# Patient Record
Sex: Male | Born: 1990 | Race: Black or African American | Hispanic: No | Marital: Single | Smoking: Current every day smoker
Health system: Southern US, Community
[De-identification: ages and names within clinical notes are randomized; demographics above are authoritative.]

## PROBLEM LIST (undated history)

## (undated) DIAGNOSIS — J45909 Unspecified asthma, uncomplicated: Secondary | ICD-10-CM

---
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
## 2013-06-06 ENCOUNTER — Encounter (HOSPITAL_COMMUNITY): Payer: Self-pay | Admitting: Emergency Medicine

## 2013-06-06 ENCOUNTER — Emergency Department (HOSPITAL_COMMUNITY)
Admission: EM | Admit: 2013-06-06 | Discharge: 2013-06-06 | Discharge status: Against Medical Advice | Payer: Self-pay | Attending: Emergency Medicine | Admitting: Emergency Medicine

## 2013-06-06 DIAGNOSIS — Y939 Activity, unspecified: Secondary | ICD-10-CM | POA: Insufficient documentation

## 2013-06-06 DIAGNOSIS — J45909 Unspecified asthma, uncomplicated: Secondary | ICD-10-CM | POA: Insufficient documentation

## 2013-06-06 DIAGNOSIS — IMO0002 Reserved for concepts with insufficient information to code with codable children: Secondary | ICD-10-CM

## 2013-06-06 DIAGNOSIS — S61509A Unspecified open wound of unspecified wrist, initial encounter: Secondary | ICD-10-CM | POA: Insufficient documentation

## 2013-06-06 DIAGNOSIS — Y929 Unspecified place or not applicable: Secondary | ICD-10-CM | POA: Insufficient documentation

## 2013-06-06 DIAGNOSIS — R296 Repeated falls: Secondary | ICD-10-CM | POA: Insufficient documentation

## 2013-06-06 DIAGNOSIS — F17200 Nicotine dependence, unspecified, uncomplicated: Secondary | ICD-10-CM | POA: Insufficient documentation

## 2013-06-06 HISTORY — DX: Unspecified asthma, uncomplicated: J45.909
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
## 2013-06-06 NOTE — Discharge Instructions (Signed)
Today you came to the Emergency Department after sustaining and injury to your hand. By not letting us clean the area you are putting yourself at risk for infection. By refusing X-ray it is possible we have missed a fracture and you will have complications with this in the long term. By refusing your TDaP booster you are putting yourself at risk for tetanus. By refusing this care you are putting yourself at risk for both short term and long term complications including infection, increased pain, and death.   Laceration Care, Adult A laceration is a cut that goes through all layers of the skin. The cut goes into the tissue beneath the skin. HOME CARE For stitches (sutures) or staples:  Keep the cut clean and dry.  If you have a bandage (dressing), change it at least once a day. Change the bandage if it gets wet or dirty, or as told by your doctor.  Wash the cut with soap and water 2 times a day. Rinse the cut with water. Pat it dry with a clean towel.  Put a thin layer of medicated cream on the cut as told by your doctor.  You may shower after the first 24 hours. Do not soak the cut in water until the stitches are removed.  Only take medicines as told by your doctor.  Have your stitches or staples removed as told by your doctor. For skin adhesive strips:  Keep the cut clean and dry.  Do not get the strips wet. You may take a bath, but be careful to keep the cut dry.  If the cut gets wet, pat it dry with a clean towel.  The strips will fall off on their own. Do not remove the strips that are still stuck to the cut. For wound glue:  You may shower or take baths. Do not soak or scrub the cut. Do not swim. Avoid heavy sweating until the glue falls off on its own. After a shower or bath, pat the cut dry with a clean towel.  Do not put medicine on your cut until the glue falls off.  If you have a bandage, do not put tape over the glue.  Avoid lots of sunlight or tanning lamps until the  glue falls off. Put sunscreen on the cut for the first year to reduce your scar.  The glue will fall off on its own. Do not pick at the glue. You may need a tetanus shot if:  You cannot remember when you had your last tetanus shot.  You have never had a tetanus shot. If you need a tetanus shot and you choose not to have one, you may get tetanus. Sickness from tetanus can be serious. GET HELP RIGHT AWAY IF:   Your pain does not get better with medicine.  Your arm, hand, leg, or foot loses feeling (numbness) or changes color.  Your cut is bleeding.  Your joint feels weak, or you cannot use your joint.  You have painful lumps on your body.  Your cut is red, puffy (swollen), or painful.  You have a red line on the skin near the cut.  You have yellowish-white fluid (pus) coming from the cut.  You have a fever.  You have a bad smell coming from the cut or bandage.  Your cut breaks open before or after stitches are removed.  You notice something coming out of the cut, such as wood or glass.  You cannot move a finger or toe. MAKE  SURE YOU:   Understand these instructions.  Will watch your condition.  Will get help right away if you are not doing well or get worse. Document Released: 10/06/2007 Document Revised: 07/12/2011 Document Reviewed: 10/13/2010 [facility] Patient Information 2014 [place], [place].

## 2013-06-06 NOTE — ED Provider Notes (Signed)
Medical screening examination/treatment/procedure(s) were performed by non-physician practitioner and as supervising physician I was immediately available for consultation/collaboration.  EKG Interpretation   None       Devoria AlbeIva Ether Goebel, MD, Armando GangFACEP   Ward GivensIva L Latanya Hemmer, MD 06/06/13 1018
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
## 2014-03-16 ENCOUNTER — Inpatient Hospital Stay (HOSPITAL_COMMUNITY)
Admission: EM | Admit: 2014-03-16 | Discharge: 2014-03-19 | DRG: 914 | Disposition: A | Discharge status: Home / Self Care | Payer: Self-pay | Attending: General Surgery | Admitting: General Surgery

## 2014-03-16 ENCOUNTER — Emergency Department (HOSPITAL_COMMUNITY): Payer: Self-pay

## 2014-03-16 ENCOUNTER — Other Ambulatory Visit: Payer: Self-pay

## 2014-03-16 DIAGNOSIS — S27321A Contusion of lung, unilateral, initial encounter: Secondary | ICD-10-CM | POA: Diagnosis present

## 2014-03-16 DIAGNOSIS — S2241XA Multiple fractures of ribs, right side, initial encounter for closed fracture: Secondary | ICD-10-CM | POA: Diagnosis present

## 2014-03-16 DIAGNOSIS — S42114A Nondisplaced fracture of body of scapula, right shoulder, initial encounter for closed fracture: Secondary | ICD-10-CM | POA: Diagnosis present

## 2014-03-16 DIAGNOSIS — W3400XA Accidental discharge from unspecified firearms or gun, initial encounter: Secondary | ICD-10-CM

## 2014-03-16 DIAGNOSIS — S21339A Puncture wound without foreign body of unspecified front wall of thorax with penetration into thoracic cavity, initial encounter: Secondary | ICD-10-CM

## 2014-03-16 DIAGNOSIS — S21331A Puncture wound without foreign body of right front wall of thorax with penetration into thoracic cavity, initial encounter: Secondary | ICD-10-CM

## 2014-03-16 DIAGNOSIS — S6431XD Injury of digital nerve of right thumb, subsequent encounter: Secondary | ICD-10-CM

## 2014-03-16 DIAGNOSIS — S21301A Unspecified open wound of right front wall of thorax with penetration into thoracic cavity, initial encounter: Principal | ICD-10-CM | POA: Diagnosis present

## 2014-03-16 DIAGNOSIS — S42101A Fracture of unspecified part of scapula, right shoulder, initial encounter for closed fracture: Secondary | ICD-10-CM | POA: Diagnosis present

## 2014-03-16 DIAGNOSIS — F17210 Nicotine dependence, cigarettes, uncomplicated: Secondary | ICD-10-CM | POA: Diagnosis present

## 2014-03-16 DIAGNOSIS — S21131A Puncture wound without foreign body of right front wall of thorax without penetration into thoracic cavity, initial encounter: Secondary | ICD-10-CM

## 2014-03-16 DIAGNOSIS — Z9689 Presence of other specified functional implants: Secondary | ICD-10-CM

## 2014-03-16 DIAGNOSIS — J942 Hemothorax: Secondary | ICD-10-CM

## 2014-03-16 DIAGNOSIS — S272XXA Traumatic hemopneumothorax, initial encounter: Secondary | ICD-10-CM | POA: Diagnosis present

## 2014-03-16 DIAGNOSIS — S56021D Laceration of flexor muscle, fascia and tendon of right thumb at forearm level, subsequent encounter: Secondary | ICD-10-CM

## 2014-03-16 DIAGNOSIS — J939 Pneumothorax, unspecified: Secondary | ICD-10-CM

## 2014-03-16 LAB — I-STAT CHEM 8, ED
BUN: 5 mg/dL — ABNORMAL LOW (ref 6–23)
Calcium, Ion: 1.08 mmol/L — ABNORMAL LOW (ref 1.12–1.23)
Chloride: 105 mEq/L (ref 96–112)
Creatinine, Ser: 1.2 mg/dL (ref 0.50–1.35)
Glucose, Bld: 106 mg/dL — ABNORMAL HIGH (ref 70–99)
HCT: 44 % (ref 39.0–52.0)
HEMOGLOBIN: 15 g/dL (ref 13.0–17.0)
Potassium: 3 mEq/L — ABNORMAL LOW (ref 3.7–5.3)
SODIUM: 141 meq/L (ref 137–147)
TCO2: 21 mmol/L (ref 0–100)

## 2014-03-16 LAB — PROTIME-INR
INR: 1.15 (ref 0.00–1.49)
PROTHROMBIN TIME: 14.9 s (ref 11.6–15.2)

## 2014-03-16 LAB — COMPREHENSIVE METABOLIC PANEL
ALT: 20 U/L (ref 0–53)
ANION GAP: 17 — AB (ref 5–15)
AST: 28 U/L (ref 0–37)
Albumin: 3.7 g/dL (ref 3.5–5.2)
Alkaline Phosphatase: 54 U/L (ref 39–117)
BUN: 7 mg/dL (ref 6–23)
CALCIUM: 8.8 mg/dL (ref 8.4–10.5)
CO2: 21 mEq/L (ref 19–32)
CREATININE: 1.03 mg/dL (ref 0.50–1.35)
Chloride: 101 mEq/L (ref 96–112)
GFR calc non Af Amer: >90 mL/min (ref >90)
GLUCOSE: 108 mg/dL — AB (ref 70–99)
Potassium: 3.3 mEq/L — ABNORMAL LOW (ref 3.7–5.3)
Sodium: 139 mEq/L (ref 137–147)
TOTAL PROTEIN: 6.9 g/dL (ref 6.0–8.3)
Total Bilirubin: 0.3 mg/dL (ref 0.3–1.2)

## 2014-03-16 LAB — URINE MICROSCOPIC-ADD ON

## 2014-03-16 LAB — CBC
HCT: 43 % (ref 39.0–52.0)
HEMOGLOBIN: 14.7 g/dL (ref 13.0–17.0)
MCH: 33 pg (ref 26.0–34.0)
MCHC: 34.2 g/dL (ref 30.0–36.0)
MCV: 96.6 fL (ref 78.0–100.0)
Platelets: 181 10*3/uL (ref 150–400)
RBC: 4.45 MIL/uL (ref 4.22–5.81)
RDW: 13.2 % (ref 11.5–15.5)
WBC: 12.6 10*3/uL — AB (ref 4.0–10.5)

## 2014-03-16 LAB — URINALYSIS, ROUTINE W REFLEX MICROSCOPIC
Bilirubin Urine: NEGATIVE
Glucose, UA: NEGATIVE mg/dL
HGB URINE DIPSTICK: NEGATIVE
Ketones, ur: NEGATIVE mg/dL
NITRITE: NEGATIVE
Protein, ur: NEGATIVE mg/dL
SPECIFIC GRAVITY, URINE: 1.006 (ref 1.005–1.030)
Urobilinogen, UA: 0.2 mg/dL (ref 0.0–1.0)
pH: 6 (ref 5.0–8.0)

## 2014-03-16 LAB — I-STAT CG4 LACTIC ACID, ED: LACTIC ACID, VENOUS: 5.73 mmol/L — AB (ref 0.5–2.2)

## 2014-03-16 LAB — RAPID URINE DRUG SCREEN, HOSP PERFORMED
Amphetamines: NOT DETECTED
BARBITURATES: NOT DETECTED
Benzodiazepines: NOT DETECTED
COCAINE: NOT DETECTED
OPIATES: NOT DETECTED
Tetrahydrocannabinol: POSITIVE — AB

## 2014-03-16 LAB — ETHANOL: Alcohol, Ethyl (B): 101 mg/dL — ABNORMAL HIGH (ref 0–11)

## 2014-03-16 MED ORDER — ONDANSETRON HCL 4 MG/2ML IJ SOLN
INTRAMUSCULAR | Status: AC
  Administered 2014-03-16: 4 mg
  Filled 2014-03-16: qty 2

## 2014-03-16 MED ORDER — MORPHINE SULFATE 2 MG/ML IJ SOLN: 2.0000 mg | Freq: Once | INTRAMUSCULAR | Status: DC

## 2014-03-16 MED ORDER — LACTATED RINGERS IV SOLN
INTRAVENOUS | Status: DC
  Administered 2014-03-16: 10 mL/h via INTRAVENOUS

## 2014-03-16 MED ORDER — LIDOCAINE HCL (CARDIAC) 20 MG/ML IV SOLN
INTRAVENOUS | Status: AC
  Filled 2014-03-16: qty 5

## 2014-03-16 MED ORDER — MORPHINE SULFATE 2 MG/ML IJ SOLN
INTRAMUSCULAR | Status: AC
  Administered 2014-03-16: 2 mg via INTRAVENOUS
  Filled 2014-03-16: qty 2

## 2014-03-16 MED ORDER — MORPHINE SULFATE 2 MG/ML IJ SOLN
2.0000 mg | Freq: Once | INTRAMUSCULAR | Status: AC
  Administered 2014-03-16 (×2): 2 mg via INTRAVENOUS

## 2014-03-16 MED ORDER — SUCCINYLCHOLINE CHLORIDE 20 MG/ML IJ SOLN
INTRAMUSCULAR | Status: AC
  Filled 2014-03-16: qty 1

## 2014-03-16 MED ORDER — ETOMIDATE 2 MG/ML IV SOLN
INTRAVENOUS | Status: AC
  Filled 2014-03-16: qty 20

## 2014-03-16 MED ORDER — ROCURONIUM BROMIDE 50 MG/5ML IV SOLN
INTRAVENOUS | Status: AC
  Filled 2014-03-16: qty 2

## 2014-03-16 MED ORDER — ONDANSETRON HCL 4 MG/2ML IJ SOLN: 4.0000 mg | Freq: Once | INTRAMUSCULAR | Status: DC

## 2014-03-16 MED ORDER — IOHEXOL 300 MG/ML  SOLN
75.0000 mL | Freq: Once | INTRAMUSCULAR | Status: AC | PRN
  Administered 2014-03-16: 75 mL via INTRAVENOUS

## 2014-03-16 NOTE — ED Notes (Signed)
VSS, updated, calm, NAD, resting on NRB 15L. No dyspnea noted.

## 2014-03-16 NOTE — ED Notes (Signed)
VSS,  no dyspnea, IVF infusing, calm, NAD, no changes, to CT 2.

## 2014-03-16 NOTE — ED Notes (Signed)
Delay in blood draw, slow return, attempt aborted for CXR, xray done.

## 2014-03-16 NOTE — ED Notes (Signed)
Removed from LSB

## 2014-03-16 NOTE — ED Notes (Signed)
[facility]

## 2014-03-16 NOTE — ED Notes (Addendum)
CT complete, no changes, pt updated. Pt uncooperative, not answering questions, yet is talkative and argumentative. Won't answer pain score.

## 2014-03-16 NOTE — ED Notes (Signed)
Onto CT table (CT2), no changes, VSS.
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
## 2014-03-16 NOTE — ED Notes (Signed)
[facility]

## 2014-03-16 NOTE — ED Notes (Signed)
Dr. Corliss Skainssuei at explaining need for chest tube.  Rationale given. Benefits and risks explained. Pt declining/ refusing. A&Ox4.  States, "I have decided, don't want a chest tube".

## 2014-03-17 ENCOUNTER — Emergency Department (HOSPITAL_COMMUNITY): Payer: Self-pay

## 2014-03-17 ENCOUNTER — Encounter (HOSPITAL_COMMUNITY): Payer: Self-pay | Admitting: *Deleted

## 2014-03-17 DIAGNOSIS — S42101A Fracture of unspecified part of scapula, right shoulder, initial encounter for closed fracture: Secondary | ICD-10-CM | POA: Diagnosis present

## 2014-03-17 DIAGNOSIS — S272XXA Traumatic hemopneumothorax, initial encounter: Secondary | ICD-10-CM | POA: Diagnosis present

## 2014-03-17 DIAGNOSIS — S21339A Puncture wound without foreign body of unspecified front wall of thorax with penetration into thoracic cavity, initial encounter: Secondary | ICD-10-CM

## 2014-03-17 DIAGNOSIS — S2241XA Multiple fractures of ribs, right side, initial encounter for closed fracture: Secondary | ICD-10-CM | POA: Diagnosis present

## 2014-03-17 DIAGNOSIS — S27321A Contusion of lung, unilateral, initial encounter: Secondary | ICD-10-CM | POA: Diagnosis present

## 2014-03-17 DIAGNOSIS — W3400XA Accidental discharge from unspecified firearms or gun, initial encounter: Secondary | ICD-10-CM

## 2014-03-17 LAB — COMPREHENSIVE METABOLIC PANEL
ALK PHOS: 51 U/L (ref 39–117)
ALT: 24 U/L (ref 0–53)
AST: 45 U/L — AB (ref 0–37)
Albumin: 3.7 g/dL (ref 3.5–5.2)
Anion gap: 10 (ref 5–15)
BILIRUBIN TOTAL: 0.8 mg/dL (ref 0.3–1.2)
BUN: 5 mg/dL — AB (ref 6–23)
CHLORIDE: 97 meq/L (ref 96–112)
CO2: 28 mEq/L (ref 19–32)
Calcium: 8.7 mg/dL (ref 8.4–10.5)
Creatinine, Ser: 0.81 mg/dL (ref 0.50–1.35)
GFR calc Af Amer: >90 mL/min (ref >90)
GFR calc non Af Amer: >90 mL/min (ref >90)
Glucose, Bld: 138 mg/dL — ABNORMAL HIGH (ref 70–99)
POTASSIUM: 4.1 meq/L (ref 3.7–5.3)
SODIUM: 135 meq/L — AB (ref 137–147)
Total Protein: 6.8 g/dL (ref 6.0–8.3)

## 2014-03-17 LAB — TYPE AND SCREEN
ABO/RH(D): A NEG
Antibody Screen: NEGATIVE
Unit division: 0
Unit division: 0

## 2014-03-17 LAB — CBC
HCT: 42.6 % (ref 39.0–52.0)
Hemoglobin: 14.5 g/dL (ref 13.0–17.0)
MCH: 32.4 pg (ref 26.0–34.0)
MCHC: 34 g/dL (ref 30.0–36.0)
MCV: 95.3 fL (ref 78.0–100.0)
PLATELETS: 168 10*3/uL (ref 150–400)
RBC: 4.47 MIL/uL (ref 4.22–5.81)
RDW: 13.3 % (ref 11.5–15.5)
WBC: 17.6 10*3/uL — AB (ref 4.0–10.5)

## 2014-03-17 LAB — ABO/RH: ABO/RH(D): A NEG

## 2014-03-17 LAB — CDS SEROLOGY

## 2014-03-17 LAB — MRSA PCR SCREENING: MRSA by PCR: NEGATIVE

## 2014-03-17 MED ORDER — MIDAZOLAM HCL 5 MG/5ML IJ SOLN
INTRAMUSCULAR | Status: AC | PRN
  Administered 2014-03-17: 2 mg via INTRAVENOUS

## 2014-03-17 MED ORDER — LIDOCAINE-EPINEPHRINE (PF) 2 %-1:200000 IJ SOLN
10.0000 mL | Freq: Once | INTRAMUSCULAR | Status: AC
  Administered 2014-03-17: 10 mL via INTRADERMAL

## 2014-03-17 MED ORDER — MIDAZOLAM HCL 2 MG/2ML IJ SOLN
INTRAMUSCULAR | Status: AC
  Administered 2014-03-17: 2 mg via INTRAVENOUS
  Filled 2014-03-17: qty 6

## 2014-03-17 MED ORDER — HYDROMORPHONE HCL 1 MG/ML IJ SOLN
0.5000 mg | INTRAMUSCULAR | Status: DC | PRN
  Administered 2014-03-18 – 2014-03-19 (×3): 0.5 mg via INTRAVENOUS
  Filled 2014-03-17 (×3): qty 1

## 2014-03-17 MED ORDER — HYDROMORPHONE HCL 1 MG/ML IJ SOLN
1.0000 mg | INTRAMUSCULAR | Status: DC | PRN
  Administered 2014-03-17 (×2): 1 mg via INTRAVENOUS
  Filled 2014-03-17 (×2): qty 1

## 2014-03-17 MED ORDER — OXYCODONE HCL 5 MG PO TABS
5.0000 mg | ORAL_TABLET | ORAL | Status: DC | PRN
  Administered 2014-03-17 (×2): 15 mg via ORAL
  Administered 2014-03-17: 10 mg via ORAL
  Administered 2014-03-17 – 2014-03-19 (×9): 15 mg via ORAL
  Filled 2014-03-17 (×7): qty 3
  Filled 2014-03-17: qty 2
  Filled 2014-03-17 (×4): qty 3

## 2014-03-17 MED ORDER — MIDAZOLAM HCL 2 MG/2ML IJ SOLN
2.0000 mg | Freq: Once | INTRAMUSCULAR | Status: AC
  Administered 2014-03-17: 2 mg via INTRAVENOUS

## 2014-03-17 MED ORDER — NAPROXEN 500 MG PO TABS
500.0000 mg | ORAL_TABLET | Freq: Two times a day (BID) | ORAL | Status: DC
  Administered 2014-03-17 – 2014-03-19 (×5): 500 mg via ORAL
  Filled 2014-03-17 (×5): qty 1
  Filled 2014-03-17: qty 2
  Filled 2014-03-17 (×2): qty 1

## 2014-03-17 MED ORDER — FENTANYL CITRATE 0.05 MG/ML IJ SOLN
INTRAMUSCULAR | Status: AC
  Administered 2014-03-17: 100 ug via INTRAVENOUS
  Filled 2014-03-17: qty 4

## 2014-03-17 MED ORDER — DOCUSATE SODIUM 100 MG PO CAPS
100.0000 mg | ORAL_CAPSULE | Freq: Two times a day (BID) | ORAL | Status: DC
  Administered 2014-03-18 – 2014-03-19 (×3): 100 mg via ORAL
  Filled 2014-03-17 (×5): qty 1

## 2014-03-17 MED ORDER — LIDOCAINE-EPINEPHRINE (PF) 2 %-1:200000 IJ SOLN
INTRAMUSCULAR | Status: AC
  Administered 2014-03-17: 10 mL via INTRADERMAL
  Filled 2014-03-17: qty 20

## 2014-03-17 MED ORDER — POLYETHYLENE GLYCOL 3350 17 G PO PACK
17.0000 g | PACK | Freq: Every day | ORAL | Status: DC
  Administered 2014-03-19: 17 g via ORAL
  Filled 2014-03-17 (×3): qty 1

## 2014-03-17 MED ORDER — ONDANSETRON HCL 4 MG PO TABS: 4.0000 mg | ORAL_TABLET | Freq: Four times a day (QID) | ORAL | Status: DC | PRN

## 2014-03-17 MED ORDER — ONDANSETRON HCL 4 MG/2ML IJ SOLN: 4.0000 mg | Freq: Four times a day (QID) | INTRAMUSCULAR | Status: DC | PRN

## 2014-03-17 MED ORDER — FENTANYL CITRATE 0.05 MG/ML IJ SOLN
100.0000 ug | Freq: Once | INTRAMUSCULAR | Status: AC
  Administered 2014-03-17: 100 ug via INTRAVENOUS

## 2014-03-17 MED ORDER — DIPHENHYDRAMINE HCL 50 MG/ML IJ SOLN
25.0000 mg | Freq: Four times a day (QID) | INTRAMUSCULAR | Status: DC | PRN
  Administered 2014-03-17 – 2014-03-18 (×3): 25 mg via INTRAVENOUS
  Filled 2014-03-17 (×3): qty 1

## 2014-03-17 MED ORDER — SODIUM CHLORIDE 0.9 % IV SOLN
Freq: Once | INTRAVENOUS | Status: AC
  Administered 2014-03-17: 20 mL/h via INTRAVENOUS

## 2014-03-17 MED ORDER — CEFAZOLIN SODIUM 1-5 GM-% IV SOLN
1.0000 g | Freq: Three times a day (TID) | INTRAVENOUS | Status: DC
  Administered 2014-03-17: 1 g via INTRAVENOUS
  Filled 2014-03-17: qty 50

## 2014-03-17 MED ORDER — ENOXAPARIN SODIUM 40 MG/0.4ML ~~LOC~~ SOLN
40.0000 mg | Freq: Every day | SUBCUTANEOUS | Status: DC
  Administered 2014-03-18 – 2014-03-19 (×2): 40 mg via SUBCUTANEOUS
  Filled 2014-03-17 (×3): qty 0.4

## 2014-03-17 MED ORDER — MIDAZOLAM HCL 10 MG/2ML IJ SOLN: 2.0000 mg | Freq: Once | INTRAMUSCULAR | Status: DC

## 2014-03-17 MED ORDER — SODIUM CHLORIDE 0.9 % IV SOLN
INTRAVENOUS | Status: DC
  Administered 2014-03-17: 06:00:00 via INTRAVENOUS
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
## 2014-03-17 NOTE — ED Notes (Signed)
Pt moved to new dry stretcher. Post chest tube CXR complete. Tolerated well. No changes, VSS. Pt updated. Remains sedated/lethargic.

## 2014-03-17 NOTE — Plan of Care (Signed)
Problem: Consults Goal: General Surgical Patient Education (See Patient Education module for education specifics)  Outcome: Progressing     

## 2014-03-17 NOTE — ED Notes (Signed)
Pt did not use urinal, urinated on self.

## 2014-03-17 NOTE — ED Notes (Signed)
O2 returned to pt, NRB 15L, VSS. Drs [facility]uei [facility].
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
## 2014-03-17 NOTE — ED Notes (Signed)
Pt given urinal.
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
## 2014-03-17 NOTE — Progress Notes (Signed)
Spoke with Dr. Izora Ribasoley on hand call who will arrange for either inpatient eval and management, or outpatient close followup for management of his right thumb tendon laceration and possible nerve injury.  Eulas PostLANDAU,Laronica Bhagat P, MD

## 2014-03-17 NOTE — ED Notes (Signed)
Pt now agreeable to chest tube. Dr. Corliss Skainssuei aware and present. PD in to speak with pt. Pt semi-cooperative and semi-communicative. Not answering many questions. Answers some.

## 2014-03-17 NOTE — ED Notes (Signed)
Pt urinated on self, chest tube being placed at this time.
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
## 2014-03-18 ENCOUNTER — Inpatient Hospital Stay (HOSPITAL_COMMUNITY): Payer: Self-pay

## 2014-03-18 LAB — PREPARE FRESH FROZEN PLASMA
UNIT DIVISION: 0
UNIT DIVISION: 0

## 2014-03-18 MED ORDER — ACETAMINOPHEN 325 MG PO TABS
650.0000 mg | ORAL_TABLET | ORAL | Status: DC | PRN
  Administered 2014-03-18: 650 mg via ORAL
  Filled 2014-03-18: qty 2

## 2014-03-18 MED ORDER — SODIUM CHLORIDE 0.9 % IJ SOLN: 3.0000 mL | INTRAMUSCULAR | Status: DC | PRN

## 2014-03-18 NOTE — Progress Notes (Signed)
UR completed.  [facility].   Carlyle LipaMichelle Noble Cicalese, RN BSN MHA CCM Trauma/Neuro ICU Case Manager [phone]
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
## 2014-03-19 ENCOUNTER — Inpatient Hospital Stay (HOSPITAL_COMMUNITY): Payer: Self-pay

## 2014-03-19 ENCOUNTER — Inpatient Hospital Stay (HOSPITAL_COMMUNITY): Payer: MEDICAID

## 2014-03-19 LAB — BLOOD PRODUCT ORDER (VERBAL) VERIFICATION

## 2014-03-19 MED ORDER — NICOTINE 21 MG/24HR TD PT24
21.0000 mg | MEDICATED_PATCH | Freq: Every day | TRANSDERMAL | Status: DC
  Administered 2014-03-19: 21 mg via TRANSDERMAL
  Filled 2014-03-19: qty 1

## 2014-03-19 MED ORDER — DSS 100 MG PO CAPS: 100.0000 mg | ORAL_CAPSULE | Freq: Two times a day (BID) | ORAL | Status: DC | PRN

## 2014-03-19 MED ORDER — POLYETHYLENE GLYCOL 3350 17 G PO PACK: 17.0000 g | PACK | Freq: Every day | ORAL | Status: DC | PRN

## 2014-03-19 MED ORDER — ACETAMINOPHEN 325 MG PO TABS: 650.0000 mg | ORAL_TABLET | Freq: Four times a day (QID) | ORAL | Status: DC | PRN

## 2014-03-19 MED ORDER — OXYCODONE HCL 5 MG PO TABS: 5.0000 mg | ORAL_TABLET | Freq: Four times a day (QID) | ORAL | Status: DC | PRN

## 2014-03-19 MED ORDER — NAPROXEN 500 MG PO TABS: 500.0000 mg | ORAL_TABLET | Freq: Two times a day (BID) | ORAL | Status: DC

## 2014-03-19 NOTE — Progress Notes (Signed)
New order given during shift for pblood by ER doctor Olivia Mackielga M Otter, MD. Spoke with Arlina RobesMegan Dotty PA.  States that is order for ED and it should not be acknowledged at this time was an order that should have been given at admission.
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
## 2014-03-21 ENCOUNTER — Emergency Department (HOSPITAL_COMMUNITY): Payer: Self-pay

## 2014-03-21 ENCOUNTER — Encounter (HOSPITAL_COMMUNITY): Payer: Self-pay | Admitting: Emergency Medicine

## 2014-03-21 ENCOUNTER — Emergency Department (HOSPITAL_COMMUNITY)
Admission: EM | Admit: 2014-03-21 | Discharge: 2014-03-21 | Disposition: A | Discharge status: Home / Self Care | Payer: Self-pay | Attending: Emergency Medicine | Admitting: Emergency Medicine

## 2014-03-21 DIAGNOSIS — Y998 Other external cause status: Secondary | ICD-10-CM | POA: Insufficient documentation

## 2014-03-21 DIAGNOSIS — J45901 Unspecified asthma with (acute) exacerbation: Secondary | ICD-10-CM | POA: Insufficient documentation

## 2014-03-21 DIAGNOSIS — R52 Pain, unspecified: Secondary | ICD-10-CM

## 2014-03-21 DIAGNOSIS — S20211A Contusion of right front wall of thorax, initial encounter: Secondary | ICD-10-CM | POA: Insufficient documentation

## 2014-03-21 DIAGNOSIS — Y9389 Activity, other specified: Secondary | ICD-10-CM | POA: Insufficient documentation

## 2014-03-21 DIAGNOSIS — X58XXXA Exposure to other specified factors, initial encounter: Secondary | ICD-10-CM | POA: Insufficient documentation

## 2014-03-21 DIAGNOSIS — Z720 Tobacco use: Secondary | ICD-10-CM | POA: Insufficient documentation

## 2014-03-21 DIAGNOSIS — Y9289 Other specified places as the place of occurrence of the external cause: Secondary | ICD-10-CM | POA: Insufficient documentation

## 2014-03-21 DIAGNOSIS — Z79899 Other long term (current) drug therapy: Secondary | ICD-10-CM | POA: Insufficient documentation

## 2014-03-21 DIAGNOSIS — S27329A Contusion of lung, unspecified, initial encounter: Secondary | ICD-10-CM

## 2014-03-21 DIAGNOSIS — R042 Hemoptysis: Secondary | ICD-10-CM | POA: Insufficient documentation

## 2014-03-21 LAB — BASIC METABOLIC PANEL
ANION GAP: 11 (ref 5–15)
BUN: 8 mg/dL (ref 6–23)
CO2: 31 mEq/L (ref 19–32)
Calcium: 10.2 mg/dL (ref 8.4–10.5)
Chloride: 100 mEq/L (ref 96–112)
Creatinine, Ser: 0.93 mg/dL (ref 0.50–1.35)
GFR calc Af Amer: >90 mL/min (ref >90)
GLUCOSE: 132 mg/dL — AB (ref 70–99)
Potassium: 4 mEq/L (ref 3.7–5.3)
Sodium: 142 mEq/L (ref 137–147)

## 2014-03-21 LAB — CBC WITH DIFFERENTIAL/PLATELET
BASOS PCT: 0 % (ref 0–1)
Basophils Absolute: 0 10*3/uL (ref 0.0–0.1)
EOS PCT: 4 % (ref 0–5)
Eosinophils Absolute: 0.3 10*3/uL (ref 0.0–0.7)
HEMATOCRIT: 35.7 % — AB (ref 39.0–52.0)
Hemoglobin: 12.7 g/dL — ABNORMAL LOW (ref 13.0–17.0)
Lymphocytes Relative: 18 % (ref 12–46)
Lymphs Abs: 1.2 10*3/uL (ref 0.7–4.0)
MCH: 32.6 pg (ref 26.0–34.0)
MCHC: 35.6 g/dL (ref 30.0–36.0)
MCV: 91.8 fL (ref 78.0–100.0)
MONOS PCT: 9 % (ref 3–12)
Monocytes Absolute: 0.6 10*3/uL (ref 0.1–1.0)
NEUTROS PCT: 69 % (ref 43–77)
Neutro Abs: 4.7 10*3/uL (ref 1.7–7.7)
PLATELETS: 190 10*3/uL (ref 150–400)
RBC: 3.89 MIL/uL — AB (ref 4.22–5.81)
RDW: 12.2 % (ref 11.5–15.5)
WBC: 6.9 10*3/uL (ref 4.0–10.5)

## 2014-03-21 LAB — PROTIME-INR
INR: 1.04 (ref 0.00–1.49)
PROTHROMBIN TIME: 13.7 s (ref 11.6–15.2)

## 2014-03-21 MED ORDER — OXYCODONE-ACETAMINOPHEN 5-325 MG PO TABS
2.0000 | ORAL_TABLET | Freq: Once | ORAL | Status: AC
  Administered 2014-03-21: 2 via ORAL

## 2014-03-21 MED ORDER — OXYCODONE-ACETAMINOPHEN 5-325 MG PO TABS
2.0000 | ORAL_TABLET | Freq: Once | ORAL | Status: DC
Start: 2014-03-21 — End: 2014-03-21
  Filled 2014-03-21: qty 2

## 2014-03-21 MED ORDER — IOHEXOL 300 MG/ML  SOLN
80.0000 mL | Freq: Once | INTRAMUSCULAR | Status: AC | PRN
  Administered 2014-03-21: 80 mL via INTRAVENOUS

## 2014-03-21 NOTE — Discharge Instructions (Signed)
Pulmonary Contusion A pulmonary contusion is a deep bruise to the lung. The lungs bring oxygen into the bloodstream and remove carbon dioxide that the body cannot use. A pulmonary contusion causes the lung tissue to swell and bleed into the surrounding area. This interferes with the ability of the lungs to function. You may feel short of breath because you are not getting enough oxygen.  CAUSES  Pulmonary contusions usually happen when there is a chest injury, such as from:  Car crashes.  Severe falls, especially from a high height.  Being near an explosion.  Sports injuries.  Job injuries.  Crush injuries, such as from industrial or farming machinery. SYMPTOMS   Shortness of breath.  Fast breathing.  Fast heart rate.  Bruising of the chest.  Chest pain.  Coughing.  Spitting blood. When the pulmonary contusion is severe, the symptoms may get worse over the first 24 to 48 hours. More serious symptoms may include:  Blueness of the lips or nail beds.  Sweating.  Feeling faint or dizzy.  Feeling confused. DIAGNOSIS  A pulmonary contusion is suspected when there is any kind of severe blow to the chest, especially when it is followed by difficulty breathing. The caregiver usually observes your breathing and checks your chest for bruised or swollen areas. Other tests may include:  Imaging tests such as:  Chest X-rays. An X-ray might not show evidence of a pulmonary contusion for 1 to 2 days after the injury, so chest X-rays may be repeated several times.  A computed tomography (CT) scan.  Magnetic resonance imaging (MRI).  Pulse oximetry. For this test, a sensor is put on the finger to measure heart rate and the amount of oxygen in the bloodstream.  Arterial blood gases. This is a blood test that measures the amount of oxygen, carbon dioxide, and acid in the blood. TREATMENT  Treatment is often given in an emergency department. Most people with a pulmonary contusion  need to be carefully checked because there may be other injuries. Treatment may include:  Taking pain medicine. People with a pulmonary contusion usually have chest pain.  Fluids given through an intravenous line (IV).  Performing breathing exercises to help with deep breathing and to avoid pneumonia. You may be asked to use a device called an incentive spirometer. This can help with deep breathing exercises.  Oxygen if there is difficulty breathing and low blood oxygen. In severe cases, you may need to have a tube placed in your throat and a machine (ventilator) to help with breathing.  Surgery if a blood vessel continues to bleed uncontrollably or if the lung has been punctured. HOME CARE INSTRUCTIONS   Only take over-the-counter or prescription medicines for pain, discomfort, or fever as directed by your caregiver. Do not take aspirin for the first few days. This may increase bruising.  Continue to do deep breathing exercises. Use the incentive spirometer if your caregiver tells you to.  Return to normal activities only when your caregiver approves. This includes driving, work, school, and sports. SEEK IMMEDIATE MEDICAL CARE IF:   You have difficulty breathing and it is getting worse.  Your chest pain gets worse.  You cough up blood.  You start to wheeze.  Your lips or nail beds appear blue.  You have a fever.  You feel dizzy, confused, or like you will faint. MAKE SURE YOU:   Understand these instructions.  Will watch your condition.  Will get help right away if you are not doing well  or get worse. Document Released: 01/27/2008 Document Revised: 07/12/2011 Document Reviewed: 03/05/2011 [facility] Patient Information 2015 [place], [place]. This information is not intended to replace advice given to you by your health care provider. Make sure you discuss any questions you have with your health care provider.

## 2014-03-21 NOTE — ED Notes (Addendum)
Ambulating pulse -ox  Low of 92% high of 98% avg O2 saturation 95%
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
## 2014-03-22 ENCOUNTER — Encounter (HOSPITAL_COMMUNITY): Payer: Self-pay | Admitting: Emergency Medicine

## 2014-04-08 ENCOUNTER — Telehealth (HOSPITAL_COMMUNITY): Payer: Self-pay
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
## 2014-08-25 ENCOUNTER — Emergency Department (HOSPITAL_COMMUNITY)
Admission: EM | Admit: 2014-08-25 | Discharge: 2014-08-25 | Disposition: A | Discharge status: Home / Self Care | Payer: Self-pay | Attending: Emergency Medicine | Admitting: Emergency Medicine

## 2014-08-25 ENCOUNTER — Emergency Department (HOSPITAL_COMMUNITY): Payer: Self-pay

## 2014-08-25 ENCOUNTER — Encounter (HOSPITAL_COMMUNITY): Payer: Self-pay | Admitting: Emergency Medicine

## 2014-08-25 DIAGNOSIS — S40211A Abrasion of right shoulder, initial encounter: Secondary | ICD-10-CM | POA: Insufficient documentation

## 2014-08-25 DIAGNOSIS — T1490XA Injury, unspecified, initial encounter: Secondary | ICD-10-CM

## 2014-08-25 DIAGNOSIS — Y9389 Activity, other specified: Secondary | ICD-10-CM | POA: Insufficient documentation

## 2014-08-25 DIAGNOSIS — Z23 Encounter for immunization: Secondary | ICD-10-CM | POA: Insufficient documentation

## 2014-08-25 DIAGNOSIS — S30810A Abrasion of lower back and pelvis, initial encounter: Secondary | ICD-10-CM | POA: Insufficient documentation

## 2014-08-25 DIAGNOSIS — Y92410 Unspecified street and highway as the place of occurrence of the external cause: Secondary | ICD-10-CM | POA: Insufficient documentation

## 2014-08-25 DIAGNOSIS — S199XXA Unspecified injury of neck, initial encounter: Secondary | ICD-10-CM | POA: Insufficient documentation

## 2014-08-25 DIAGNOSIS — Z791 Long term (current) use of non-steroidal anti-inflammatories (NSAID): Secondary | ICD-10-CM | POA: Insufficient documentation

## 2014-08-25 DIAGNOSIS — Y998 Other external cause status: Secondary | ICD-10-CM | POA: Insufficient documentation

## 2014-08-25 DIAGNOSIS — S0990XA Unspecified injury of head, initial encounter: Secondary | ICD-10-CM | POA: Insufficient documentation

## 2014-08-25 DIAGNOSIS — S20212A Contusion of left front wall of thorax, initial encounter: Secondary | ICD-10-CM | POA: Insufficient documentation

## 2014-08-25 DIAGNOSIS — S0012XA Contusion of left eyelid and periocular area, initial encounter: Secondary | ICD-10-CM | POA: Insufficient documentation

## 2014-08-25 DIAGNOSIS — Z720 Tobacco use: Secondary | ICD-10-CM | POA: Insufficient documentation

## 2014-08-25 DIAGNOSIS — J45909 Unspecified asthma, uncomplicated: Secondary | ICD-10-CM | POA: Insufficient documentation

## 2014-08-25 LAB — CBC
HEMATOCRIT: 46 % (ref 39.0–52.0)
HEMOGLOBIN: 16 g/dL (ref 13.0–17.0)
MCH: 31.6 pg (ref 26.0–34.0)
MCHC: 34.8 g/dL (ref 30.0–36.0)
MCV: 90.7 fL (ref 78.0–100.0)
Platelets: 179 10*3/uL (ref 150–400)
RBC: 5.07 MIL/uL (ref 4.22–5.81)
RDW: 14.2 % (ref 11.5–15.5)
WBC: 14.7 10*3/uL — AB (ref 4.0–10.5)

## 2014-08-25 LAB — BASIC METABOLIC PANEL
ANION GAP: 8 (ref 5–15)
BUN: 7 mg/dL (ref 6–23)
CALCIUM: 9.7 mg/dL (ref 8.4–10.5)
CHLORIDE: 100 mmol/L (ref 96–112)
CO2: 30 mmol/L (ref 19–32)
CREATININE: 0.98 mg/dL (ref 0.50–1.35)
GFR calc non Af Amer: >90 mL/min (ref >90)
GLUCOSE: 100 mg/dL — AB (ref 70–99)
Potassium: 4.1 mmol/L (ref 3.5–5.1)
Sodium: 138 mmol/L (ref 135–145)

## 2014-08-25 MED ORDER — IOHEXOL 300 MG/ML  SOLN
80.0000 mL | Freq: Once | INTRAMUSCULAR | Status: AC | PRN
Start: 2014-08-25 — End: 2014-08-25
  Administered 2014-08-25: 80 mL via INTRAVENOUS

## 2014-08-25 MED ORDER — TETANUS-DIPHTH-ACELL PERTUSSIS 5-2.5-18.5 LF-MCG/0.5 IM SUSP
0.5000 mL | Freq: Once | INTRAMUSCULAR | Status: AC
  Administered 2014-08-25: 0.5 mL via INTRAMUSCULAR
  Filled 2014-08-25: qty 0.5

## 2014-08-25 MED ORDER — HYDROCODONE-ACETAMINOPHEN 5-325 MG PO TABS
2.0000 | ORAL_TABLET | ORAL | Status: DC | PRN
Start: 2014-08-25 — End: 2015-01-25

## 2014-08-25 MED ORDER — FENTANYL CITRATE (PF) 100 MCG/2ML IJ SOLN
50.0000 ug | Freq: Once | INTRAMUSCULAR | Status: AC
  Administered 2014-08-25: 50 ug via INTRAVENOUS
  Filled 2014-08-25: qty 2

## 2014-08-25 MED ORDER — OXYCODONE-ACETAMINOPHEN 5-325 MG PO TABS
2.0000 | ORAL_TABLET | Freq: Once | ORAL | Status: AC
  Administered 2014-08-25: 2 via ORAL
  Filled 2014-08-25: qty 2

## 2014-08-25 NOTE — ED Notes (Signed)
c-collar placed in triage
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
## 2014-08-25 NOTE — ED Notes (Signed)
Pt removed c-collar from neck, states that he was trying to get some rest and that he has not had any rest all day, explained to pt why c-collar was placed and need to r/o fx before removing, pt stated I could put it back on if he was going to xray right now, explained to pt delay for xray
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
## 2015-01-25 ENCOUNTER — Emergency Department (HOSPITAL_COMMUNITY): Payer: No Typology Code available for payment source

## 2015-01-25 ENCOUNTER — Encounter (HOSPITAL_COMMUNITY): Payer: Self-pay | Admitting: *Deleted

## 2015-01-25 ENCOUNTER — Emergency Department (HOSPITAL_COMMUNITY)
Admission: EM | Admit: 2015-01-25 | Discharge: 2015-01-25 | Disposition: A | Discharge status: Home / Self Care | Payer: No Typology Code available for payment source | Attending: Emergency Medicine | Admitting: Emergency Medicine

## 2015-01-25 DIAGNOSIS — Y998 Other external cause status: Secondary | ICD-10-CM | POA: Insufficient documentation

## 2015-01-25 DIAGNOSIS — S8391XA Sprain of unspecified site of right knee, initial encounter: Secondary | ICD-10-CM | POA: Diagnosis not present

## 2015-01-25 DIAGNOSIS — S6992XA Unspecified injury of left wrist, hand and finger(s), initial encounter: Secondary | ICD-10-CM | POA: Diagnosis not present

## 2015-01-25 DIAGNOSIS — S6391XA Sprain of unspecified part of right wrist and hand, initial encounter: Secondary | ICD-10-CM | POA: Diagnosis not present

## 2015-01-25 DIAGNOSIS — Z720 Tobacco use: Secondary | ICD-10-CM | POA: Diagnosis not present

## 2015-01-25 DIAGNOSIS — S00402A Unspecified superficial injury of left ear, initial encounter: Secondary | ICD-10-CM | POA: Insufficient documentation

## 2015-01-25 DIAGNOSIS — S80851A Superficial foreign body, right lower leg, initial encounter: Secondary | ICD-10-CM | POA: Insufficient documentation

## 2015-01-25 DIAGNOSIS — Y9389 Activity, other specified: Secondary | ICD-10-CM | POA: Insufficient documentation

## 2015-01-25 DIAGNOSIS — Y9289 Other specified places as the place of occurrence of the external cause: Secondary | ICD-10-CM | POA: Diagnosis not present

## 2015-01-25 DIAGNOSIS — T148 Other injury of unspecified body region: Secondary | ICD-10-CM | POA: Insufficient documentation

## 2015-01-25 DIAGNOSIS — J45909 Unspecified asthma, uncomplicated: Secondary | ICD-10-CM | POA: Insufficient documentation

## 2015-01-25 DIAGNOSIS — T07XXXA Unspecified multiple injuries, initial encounter: Secondary | ICD-10-CM

## 2015-01-25 DIAGNOSIS — S8991XA Unspecified injury of right lower leg, initial encounter: Secondary | ICD-10-CM | POA: Diagnosis present

## 2015-01-25 MED ORDER — OXYCODONE-ACETAMINOPHEN 5-325 MG PO TABS
1.0000 | ORAL_TABLET | Freq: Once | ORAL | Status: AC
Start: 2015-01-25 — End: 2015-01-25
  Administered 2015-01-25: 1 via ORAL
  Filled 2015-01-25: qty 1

## 2015-01-25 MED ORDER — IBUPROFEN 600 MG PO TABS: 600.0000 mg | ORAL_TABLET | Freq: Four times a day (QID) | ORAL | Status: DC | PRN

## 2015-01-25 MED ORDER — AMOXICILLIN-POT CLAVULANATE 875-125 MG PO TABS: 1.0000 | ORAL_TABLET | Freq: Two times a day (BID) | ORAL | Status: DC
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
## 2019-10-05 ENCOUNTER — Other Ambulatory Visit: Payer: Self-pay

## 2019-10-05 ENCOUNTER — Encounter (HOSPITAL_COMMUNITY): Payer: Self-pay

## 2019-10-05 DIAGNOSIS — F17210 Nicotine dependence, cigarettes, uncomplicated: Secondary | ICD-10-CM | POA: Insufficient documentation

## 2019-10-05 DIAGNOSIS — K644 Residual hemorrhoidal skin tags: Secondary | ICD-10-CM | POA: Insufficient documentation

## 2019-10-05 NOTE — ED Triage Notes (Signed)
Pt reports a "bubble" at his rectum. Pain 3/10. Denies bleeding.

## 2019-10-06 ENCOUNTER — Emergency Department (HOSPITAL_COMMUNITY)
Admission: EM | Admit: 2019-10-06 | Discharge: 2019-10-06 | Disposition: A | Discharge status: Home / Self Care | Payer: Self-pay | Attending: Emergency Medicine | Admitting: Emergency Medicine

## 2019-10-06 DIAGNOSIS — K644 Residual hemorrhoidal skin tags: Secondary | ICD-10-CM

## 2019-10-06 MED ORDER — HYDROCORTISONE ACETATE 25 MG RE SUPP: 25.0000 mg | Freq: Two times a day (BID) | RECTAL | 0 refills | Status: DC

## 2019-10-06 NOTE — ED Provider Notes (Signed)
TIME SEEN: 3:10 AM  CHIEF COMPLAINT: Concern for possible hemorrhoid  HPI: Patient is a 29 year old male with history of asthma who presents to the emergency department wanting to be evaluated because he is concerned he could have a hemorrhoid.  States when wiping he noticed a "bubble" in his bottom.  He has not had any rectal pain.  No bleeding.  Has had normal bowel movements.  No abdominal pain, fever, vomiting.  ROS: See HPI Constitutional: no fever  Eyes: no drainage  ENT: no runny nose   Cardiovascular:  no chest pain  Resp: no SOB  GI: no vomiting GU: no dysuria Integumentary: no rash  Allergy: no hives  Musculoskeletal: no leg swelling  Neurological: no slurred speech ROS otherwise negative  PAST MEDICAL HISTORY/PAST SURGICAL HISTORY:  Past Medical History:  Diagnosis Date  . Asthma     MEDICATIONS:  Prior to Admission medications   Medication Sig Start Date End Date Taking? Authorizing Provider  amoxicillin-clavulanate (AUGMENTIN) 875-125 MG per tablet Take 1 tablet by mouth 2 (two) times daily. One po bid x 7 days 01/25/15   Ripley Fraise, MD  ibuprofen (ADVIL,MOTRIN) 600 MG tablet Take 1 tablet (600 mg total) by mouth every 6 (six) hours as needed. 01/25/15   Ripley Fraise, MD    ALLERGIES:  No Known Allergies  SOCIAL HISTORY:  Social History   Tobacco Use  . Smoking status: Current Every Day Smoker    Packs/day: 1.00  Substance Use Topics  . Alcohol use: Yes    Comment: daily    FAMILY HISTORY: History reviewed. No pertinent family history.  EXAM: BP 110/63 (BP Location: Right Arm)   Pulse 66   Temp 98 F (36.7 C)   Resp 18   SpO2 98%  CONSTITUTIONAL: Alert and oriented and responds appropriately to questions. Well-appearing; well-nourished HEAD: Normocephalic EYES: Conjunctivae clear, pupils appear equal, EOM appear intact ENT: normal nose; moist mucous membranes NECK: Supple, normal ROM CARD: RRR; S1 and S2 appreciated; no murmurs, no  clicks, no rubs, no gallops RESP: Normal chest excursion without splinting or tachypnea; breath sounds clear and equal bilaterally; no wheezes, no rhonchi, no rales, no hypoxia or respiratory distress, speaking full sentences ABD/GI: Normal bowel sounds; non-distended; soft, non-tender, no rebound, no guarding, no peritoneal signs, no hepatosplenomegaly BACK:  The back appears normal RECTAL: Patient has 1 small nonthrombosed and nonbleeding external hemorrhoid on exam without significant tenderness, there is no sign of infection including redness, warmth, fluctuance or induration, internal rectal exam deferred at this time, chaperone at bedside.  No crepitus, perineal warmth or erythema. EXT: Normal ROM in all joints; no deformity noted, no edema; no cyanosis SKIN: Normal color for age and race; warm; no rash on exposed skin NEURO: Moves all extremities equally PSYCH: The patient's mood and manner are appropriate.   MEDICAL DECISION MAKING: Patient here with one external hemorrhoid.  States he is here because he just wanted to know what it was that he was feeling when he touched his anus.  It is not thrombosed.  Is not bleeding.  It is not causing pain.  His abdominal exam is benign.  He has no fevers or vomiting and reports normal bowel movements.  No sign of rectal abscess, Fournier's gangrene, cellulitis.  Will discharge with Anusol to use as needed.  I feel patient is safe to be discharged without further emergent work-up.  At this time, I do not feel there is any life-threatening condition present. I have reviewed, interpreted  and discussed all results (EKG, imaging, lab, urine as appropriate) and exam findings with patient/family. I have reviewed nursing notes and appropriate previous records.  I feel the patient is safe to be discharged home without further emergent workup and can continue workup as an outpatient as needed. Discussed usual and customary return precautions. Patient/family verbalize  understanding and are comfortable with this plan.  Outpatient follow-up has been provided as needed. All questions have been answered.   Reginald Ballard was evaluated in Emergency Department on 10/06/2019 for the symptoms described in the history of present illness. He was evaluated in the context of the global COVID-19 pandemic, which necessitated consideration that the patient might be at risk for infection with the SARS-CoV-2 virus that causes COVID-19. Institutional protocols and algorithms that pertain to the evaluation of patients at risk for COVID-19 are in a state of rapid change based on information released by regulatory bodies including the CDC and federal and state organizations. These policies and algorithms were followed during the patient's care in the ED.      Gilman Olazabal, Layla Maw, DO 10/06/19 [phone]

## 2019-10-06 NOTE — Discharge Instructions (Signed)
For your hemorrhoids, I recommend that you use a stool softener such as Colace 100 mg twice a day to keep your bowel movements soft.  I also recommend that you do not use toilet paper and instead use witch hazel wipes or baby wipes. A high-fiber diet will also help to keep your stools soft as well increasing your water intake. If you cannot eat foods high in fiber, you may use Benefiber or Metamucil over-the-counter once daily. I also recommend over the counter preparation H for your hemorrhoids as well to help with inflammation.    Steps to find a Primary Care Provider (PCP):  Call [phone] or [phone] to access "[facility] Find a Doctor Service."  2.  You may also go on the [facility] website at [email or web address]  3.  [facility] also frequently accepts new patients.  [facility]  [address] [place] [address] [phone]  4.  There are also multiple [facility], [facility] and [facility] practices throughout the [place] that are frequently accepting new patients. You may find a clinic that is close to your home and contact them.  [facility] [email or web address] [phone]  [email or web address]  [email or web address] [phone]  [facility] [email or web address] [phone]  5.  Local Health Departments also can provide primary care services.  [facility]  [address] [place] [address] [phone]  [facility] Department [address] [place] [address] [phone]  [facility] [address] [place] [address]  [place] [address] [phone]

## 2019-10-13 ENCOUNTER — Encounter (HOSPITAL_COMMUNITY): Payer: Self-pay | Admitting: Emergency Medicine

## 2019-10-13 ENCOUNTER — Emergency Department (HOSPITAL_COMMUNITY)
Admission: EM | Admit: 2019-10-13 | Discharge: 2019-10-13 | Disposition: A | Discharge status: Home / Self Care | Payer: Self-pay | Attending: Emergency Medicine | Admitting: Emergency Medicine

## 2019-10-13 ENCOUNTER — Emergency Department (HOSPITAL_COMMUNITY): Payer: Self-pay

## 2019-10-13 ENCOUNTER — Other Ambulatory Visit: Payer: Self-pay

## 2019-10-13 DIAGNOSIS — F17210 Nicotine dependence, cigarettes, uncomplicated: Secondary | ICD-10-CM | POA: Insufficient documentation

## 2019-10-13 DIAGNOSIS — R079 Chest pain, unspecified: Secondary | ICD-10-CM | POA: Insufficient documentation

## 2019-10-13 DIAGNOSIS — Z79899 Other long term (current) drug therapy: Secondary | ICD-10-CM | POA: Insufficient documentation

## 2019-10-13 DIAGNOSIS — J45909 Unspecified asthma, uncomplicated: Secondary | ICD-10-CM | POA: Insufficient documentation

## 2019-10-13 LAB — BASIC METABOLIC PANEL
Anion gap: 10 (ref 5–15)
BUN: 7 mg/dL (ref 6–20)
CO2: 26 mmol/L (ref 22–32)
Calcium: 9.2 mg/dL (ref 8.9–10.3)
Chloride: 104 mmol/L (ref 98–111)
Creatinine, Ser: 1.1 mg/dL (ref 0.61–1.24)
GFR calc Af Amer: >60 mL/min (ref >60)
GFR calc non Af Amer: >60 mL/min (ref >60)
Glucose, Bld: 96 mg/dL (ref 70–99)
Potassium: 3.5 mmol/L (ref 3.5–5.1)
Sodium: 140 mmol/L (ref 135–145)

## 2019-10-13 LAB — TROPONIN I (HIGH SENSITIVITY)
Troponin I (High Sensitivity): 6 ng/L (ref <18)
Troponin I (High Sensitivity): 5 ng/L (ref <18)

## 2019-10-13 LAB — CBC
HCT: 42.5 % (ref 39.0–52.0)
Hemoglobin: 14.4 g/dL (ref 13.0–17.0)
MCH: 30.6 pg (ref 26.0–34.0)
MCHC: 33.9 g/dL (ref 30.0–36.0)
MCV: 90.2 fL (ref 80.0–100.0)
Platelets: 175 10*3/uL (ref 150–400)
RBC: 4.71 MIL/uL (ref 4.22–5.81)
RDW: 13.2 % (ref 11.5–15.5)
WBC: 6.7 10*3/uL (ref 4.0–10.5)
nRBC: 0 % (ref 0.0–0.2)

## 2019-10-13 LAB — PROTIME-INR
INR: 1.2 (ref 0.8–1.2)
Prothrombin Time: 14.7 seconds (ref 11.4–15.2)

## 2019-10-13 MED ORDER — SODIUM CHLORIDE 0.9% FLUSH: 3.0000 mL | Freq: Once | INTRAVENOUS | Status: DC

## 2019-10-13 MED ORDER — FLUTICASONE PROPIONATE 50 MCG/ACT NA SUSP: 1.0000 | Freq: Every day | NASAL | 0 refills | Status: DC | PRN

## 2019-10-13 MED ORDER — NAPROXEN 500 MG PO TABS: 500.0000 mg | ORAL_TABLET | Freq: Two times a day (BID) | ORAL | 0 refills | Status: DC | PRN
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
## 2019-10-13 NOTE — ED Notes (Signed)
Pt verbalized understanding of d/c instructions, follow up care and s/s requiring return to ed. Pt had no further questions at this time. Pt refused wheelchair and ambulated to exit.

## 2019-10-13 NOTE — Discharge Instructions (Addendum)
You were seen in the emergency department today for chest pain. Your work-up in the emergency department has been overall reassuring. Your labs have been fairly normal and or similar to previous blood work you have had done. Your EKG and the enzyme we use to check your heart did not show an acute heart attack at this time. Your chest x-ray was normal.   Please stop smoking. Please use Flonase 1 spray per nostril daily as needed for nasal congestion.  Take naproxen as needed for pain- this is a nonsteroidal anti-inflammatory medication that will help with pain and swelling. Be sure to take this medication as prescribed with food, 1 pill every 12 hours,  It should be taken with food, as it can cause stomach upset, and more seriously, stomach bleeding. Do not take other nonsteroidal anti-inflammatory medications with this such as Advil, Motrin, Aleve, Mobic, Goodie Powder, or Motrin.     You make take Tylenol per over the counter dosing with these medications.   We have prescribed you new medication(s) today. Discuss the medications prescribed today with your pharmacist as they can have adverse effects and interactions with your other medicines including over the counter and prescribed medications. Seek medical evaluation if you start to experience new or abnormal symptoms after taking one of these medicines, seek care immediately if you start to experience difficulty breathing, feeling of your throat closing, facial swelling, or rash as these could be indications of a more serious allergic reaction   We would like you to follow up closely with your primary care provider and/or the cardiologist provided in your discharge instructions within 1-3 days. Return to the ER immediately should you experience any new or worsening symptoms including but not limited to return of pain, worsened pain, vomiting, shortness of breath, dizziness, lightheadedness, passing out, or any other concerns that you may have.

## 2019-10-13 NOTE — ED Triage Notes (Signed)
Patient reports mid chest pain for 2 days , denies cough or fever , no SOB , denies emesis or diaphoresis .

## 2019-10-13 NOTE — ED Notes (Signed)
Pt said he is going to wait in car

## 2019-10-20 ENCOUNTER — Emergency Department (HOSPITAL_COMMUNITY)
Admission: EM | Admit: 2019-10-20 | Discharge: 2019-10-21 | Disposition: A | Discharge status: Home / Self Care | Payer: Self-pay | Attending: Emergency Medicine | Admitting: Emergency Medicine

## 2019-10-20 ENCOUNTER — Other Ambulatory Visit: Payer: Self-pay

## 2019-10-20 DIAGNOSIS — R0789 Other chest pain: Secondary | ICD-10-CM

## 2019-10-20 DIAGNOSIS — J40 Bronchitis, not specified as acute or chronic: Secondary | ICD-10-CM | POA: Insufficient documentation

## 2019-10-20 DIAGNOSIS — F17210 Nicotine dependence, cigarettes, uncomplicated: Secondary | ICD-10-CM | POA: Insufficient documentation

## 2019-10-20 MED ORDER — SODIUM CHLORIDE 0.9% FLUSH: 3.0000 mL | Freq: Once | INTRAVENOUS | Status: DC

## 2019-10-20 NOTE — ED Notes (Signed)
Pt step outside 

## 2019-10-20 NOTE — ED Triage Notes (Signed)
Pt presents to ED POV. Pt c/o central CP that radiates to his back. Pt states seen here before and given naproxen. Taken w no relief.

## 2019-10-21 ENCOUNTER — Emergency Department (HOSPITAL_COMMUNITY): Payer: Self-pay

## 2019-10-21 LAB — CBC
HCT: 43.4 % (ref 39.0–52.0)
Hemoglobin: 14.4 g/dL (ref 13.0–17.0)
MCH: 29.9 pg (ref 26.0–34.0)
MCHC: 33.2 g/dL (ref 30.0–36.0)
MCV: 90.2 fL (ref 80.0–100.0)
Platelets: 215 10*3/uL (ref 150–400)
RBC: 4.81 MIL/uL (ref 4.22–5.81)
RDW: 12.9 % (ref 11.5–15.5)
WBC: 9.4 10*3/uL (ref 4.0–10.5)
nRBC: 0 % (ref 0.0–0.2)

## 2019-10-21 LAB — BASIC METABOLIC PANEL
Anion gap: 10 (ref 5–15)
BUN: 9 mg/dL (ref 6–20)
CO2: 26 mmol/L (ref 22–32)
Calcium: 9.4 mg/dL (ref 8.9–10.3)
Chloride: 103 mmol/L (ref 98–111)
Creatinine, Ser: 1.01 mg/dL (ref 0.61–1.24)
GFR calc Af Amer: >60 mL/min (ref >60)
GFR calc non Af Amer: >60 mL/min (ref >60)
Glucose, Bld: 88 mg/dL (ref 70–99)
Potassium: 3.6 mmol/L (ref 3.5–5.1)
Sodium: 139 mmol/L (ref 135–145)

## 2019-10-21 LAB — TROPONIN I (HIGH SENSITIVITY)
Troponin I (High Sensitivity): 3 ng/L (ref <18)
Troponin I (High Sensitivity): <2 ng/L (ref <18)

## 2019-10-21 MED ORDER — ALBUTEROL SULFATE HFA 108 (90 BASE) MCG/ACT IN AERS
2.0000 | INHALATION_SPRAY | RESPIRATORY_TRACT | Status: DC
  Administered 2019-10-21: 2 via RESPIRATORY_TRACT
  Filled 2019-10-21: qty 6.7

## 2019-10-21 MED ORDER — LORATADINE 10 MG PO TABS
10.0000 mg | ORAL_TABLET | Freq: Every day | ORAL | 0 refills | Status: DC
Start: 2019-10-21 — End: 2019-12-12

## 2019-10-21 MED ORDER — DOXYCYCLINE HYCLATE 100 MG PO CAPS
100.0000 mg | ORAL_CAPSULE | Freq: Two times a day (BID) | ORAL | 0 refills | Status: DC
Start: 2019-10-21 — End: 2019-10-30

## 2019-10-21 MED ORDER — PREDNISONE 10 MG (21) PO TBPK
ORAL_TABLET | Freq: Every day | ORAL | 0 refills | Status: DC
Start: 2019-10-21 — End: 2019-10-30

## 2019-10-21 MED ORDER — BENZONATATE 100 MG PO CAPS
100.0000 mg | ORAL_CAPSULE | Freq: Three times a day (TID) | ORAL | 0 refills | Status: DC | PRN
Start: 2019-10-21 — End: 2019-12-12

## 2019-10-21 MED ORDER — ALBUTEROL SULFATE HFA 108 (90 BASE) MCG/ACT IN AERS: 1.0000 | INHALATION_SPRAY | RESPIRATORY_TRACT | 0 refills | Status: DC | PRN

## 2019-10-21 MED ORDER — IBUPROFEN 600 MG PO TABS
600.0000 mg | ORAL_TABLET | Freq: Four times a day (QID) | ORAL | 0 refills | Status: DC | PRN
Start: 2019-10-21 — End: 2019-12-11

## 2019-10-21 MED ORDER — PREDNISONE 20 MG PO TABS
40.0000 mg | ORAL_TABLET | Freq: Once | ORAL | Status: AC
  Administered 2019-10-21: 40 mg via ORAL
  Filled 2019-10-21: qty 2

## 2019-10-21 NOTE — Discharge Instructions (Addendum)
You were seen in the ER for a cough and chest pain  Lab work, chest x-ray, cardiac enzymes and EKG are okay  I think your symptoms are from bronchitis that has flared your asthma.  Seasonal allergies can also be causing this.  I have printed out prescriptions for you.  Loratadine (Claritin) 10 mg should be taken every morning, daily for control of seasonal allergies.  Uncontrolled seasonal allergies can cause chronic cough.  Benzonatate (Tessalon Perles) 100 mg every 8 hours should be taking for suppression of cough.  Doxycycline (Vibramycin) 10 mg every 12 hours is an antibiotic that should be taking to help with infection  Ibuprofen 600 mg every 6 hours or as needed should be taking for chest wall pain.  Do not mix ibuprofen and naproxen together.  Prednisone pack is a steroid to help with asthma flares and inflammation  Albuterol inhaler should be used every 4 hours or as needed for cough, wheezing.  Symptoms may take 5 to 7 days to improve after starting medicines.  You may have a lingering cough for weeks.  You can use over-the-counter anticough medicine as needed for cough control.

## 2019-10-21 NOTE — ED Notes (Signed)
Pt went to go sit in car
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
## 2019-10-21 NOTE — ED Notes (Signed)
PA gave pt discharge papers before getting last set of vitals. PA stated pt understood discharge instructions.

## 2019-10-21 NOTE — ED Notes (Signed)
Pt step outside 

## 2019-10-30 ENCOUNTER — Encounter (HOSPITAL_COMMUNITY): Payer: Self-pay

## 2019-10-30 ENCOUNTER — Emergency Department (HOSPITAL_COMMUNITY)
Admission: EM | Admit: 2019-10-30 | Discharge: 2019-10-30 | Disposition: A | Discharge status: Home / Self Care | Payer: Self-pay | Attending: Emergency Medicine | Admitting: Emergency Medicine

## 2019-10-30 ENCOUNTER — Emergency Department (HOSPITAL_COMMUNITY): Payer: Self-pay

## 2019-10-30 DIAGNOSIS — R0781 Pleurodynia: Secondary | ICD-10-CM | POA: Insufficient documentation

## 2019-10-30 DIAGNOSIS — R079 Chest pain, unspecified: Secondary | ICD-10-CM

## 2019-10-30 DIAGNOSIS — R1110 Vomiting, unspecified: Secondary | ICD-10-CM | POA: Insufficient documentation

## 2019-10-30 DIAGNOSIS — Y999 Unspecified external cause status: Secondary | ICD-10-CM | POA: Insufficient documentation

## 2019-10-30 DIAGNOSIS — R05 Cough: Secondary | ICD-10-CM | POA: Insufficient documentation

## 2019-10-30 DIAGNOSIS — R0789 Other chest pain: Secondary | ICD-10-CM | POA: Insufficient documentation

## 2019-10-30 DIAGNOSIS — Y9389 Activity, other specified: Secondary | ICD-10-CM | POA: Insufficient documentation

## 2019-10-30 DIAGNOSIS — Y9239 Other specified sports and athletic area as the place of occurrence of the external cause: Secondary | ICD-10-CM | POA: Insufficient documentation

## 2019-10-30 DIAGNOSIS — J45909 Unspecified asthma, uncomplicated: Secondary | ICD-10-CM | POA: Insufficient documentation

## 2019-10-30 DIAGNOSIS — R1013 Epigastric pain: Secondary | ICD-10-CM | POA: Insufficient documentation

## 2019-10-30 DIAGNOSIS — X500XXA Overexertion from strenuous movement or load, initial encounter: Secondary | ICD-10-CM | POA: Insufficient documentation

## 2019-10-30 DIAGNOSIS — Z79899 Other long term (current) drug therapy: Secondary | ICD-10-CM | POA: Insufficient documentation

## 2019-10-30 DIAGNOSIS — F17210 Nicotine dependence, cigarettes, uncomplicated: Secondary | ICD-10-CM | POA: Insufficient documentation

## 2019-10-30 LAB — TROPONIN I (HIGH SENSITIVITY)
Troponin I (High Sensitivity): 4 ng/L (ref <18)
Troponin I (High Sensitivity): 4 ng/L (ref <18)

## 2019-10-30 LAB — HEPATIC FUNCTION PANEL
ALT: 15 U/L (ref 0–44)
AST: 15 U/L (ref 15–41)
Albumin: 3.9 g/dL (ref 3.5–5.0)
Alkaline Phosphatase: 51 U/L (ref 38–126)
Bilirubin, Direct: 0.1 mg/dL (ref 0.0–0.2)
Indirect Bilirubin: 0.7 mg/dL (ref 0.3–0.9)
Total Bilirubin: 0.8 mg/dL (ref 0.3–1.2)
Total Protein: 6.8 g/dL (ref 6.5–8.1)

## 2019-10-30 LAB — URINALYSIS, ROUTINE W REFLEX MICROSCOPIC
Bilirubin Urine: NEGATIVE
Glucose, UA: NEGATIVE mg/dL
Hgb urine dipstick: NEGATIVE
Ketones, ur: NEGATIVE mg/dL
Leukocytes,Ua: NEGATIVE
Nitrite: NEGATIVE
Protein, ur: NEGATIVE mg/dL
Specific Gravity, Urine: 1 — ABNORMAL LOW (ref 1.005–1.030)
pH: 6 (ref 5.0–8.0)

## 2019-10-30 LAB — BASIC METABOLIC PANEL
Anion gap: 11 (ref 5–15)
BUN: 11 mg/dL (ref 6–20)
CO2: 25 mmol/L (ref 22–32)
Calcium: 9.3 mg/dL (ref 8.9–10.3)
Chloride: 104 mmol/L (ref 98–111)
Creatinine, Ser: 1.09 mg/dL (ref 0.61–1.24)
GFR calc Af Amer: >60 mL/min (ref >60)
GFR calc non Af Amer: >60 mL/min (ref >60)
Glucose, Bld: 137 mg/dL — ABNORMAL HIGH (ref 70–99)
Potassium: 3.4 mmol/L — ABNORMAL LOW (ref 3.5–5.1)
Sodium: 140 mmol/L (ref 135–145)

## 2019-10-30 LAB — LIPASE, BLOOD: Lipase: 23 U/L (ref 11–51)

## 2019-10-30 LAB — CBC
HCT: 40.5 % (ref 39.0–52.0)
Hemoglobin: 13.8 g/dL (ref 13.0–17.0)
MCH: 30.8 pg (ref 26.0–34.0)
MCHC: 34.1 g/dL (ref 30.0–36.0)
MCV: 90.4 fL (ref 80.0–100.0)
Platelets: 191 10*3/uL (ref 150–400)
RBC: 4.48 MIL/uL (ref 4.22–5.81)
RDW: 13.1 % (ref 11.5–15.5)
WBC: 9.4 10*3/uL (ref 4.0–10.5)
nRBC: 0 % (ref 0.0–0.2)

## 2019-10-30 MED ORDER — SODIUM CHLORIDE 0.9% FLUSH: 3.0000 mL | Freq: Once | INTRAVENOUS | Status: DC

## 2019-10-30 MED ORDER — OMEPRAZOLE 20 MG PO CPDR
20.0000 mg | DELAYED_RELEASE_CAPSULE | Freq: Every day | ORAL | 0 refills | Status: AC
Start: 2019-10-30 — End: ?

## 2019-10-30 MED ORDER — METHOCARBAMOL 500 MG PO TABS
500.0000 mg | ORAL_TABLET | Freq: Three times a day (TID) | ORAL | 0 refills | Status: DC | PRN
Start: 2019-10-30 — End: 2019-12-12

## 2019-10-30 NOTE — Discharge Instructions (Addendum)
You were seen in the emergency department today for chest pain. Your work-up in the emergency department has been overall reassuring. Your labs have been fairly normal and or similar to previous blood work you have had done. Your EKG and the enzyme we use to check your heart did not show an acute heart attack at this time. Your chest x-ray was normal.   We suspect that your pain may be related to a muscular issue otherwise known as costochondritis or could be stomach irritation while the ibuprofen you are taking.  Please stop the ibuprofen and start the following medicines:  - Robaxin is the muscle relaxer I have prescribed, this is meant to help with muscle tightness. Be aware that this medication may make you drowsy therefore the first time you take this it should be at a time you are in an environment where you can rest. Do not drive or operate heavy machinery when taking this medication. Do not drink alcohol or take other sedating medications with this medicine such as narcotics or benzodiazepines.  -Omeprazole is a antiacid to take to help with stomach irritation, please take this once per day in the morning prior to meals.  You make take Tylenol per over the counter dosing with these medications.   We have prescribed you new medication(s) today. Discuss the medications prescribed today with your pharmacist as they can have adverse effects and interactions with your other medicines including over the counter and prescribed medications. Seek medical evaluation if you start to experience new or abnormal symptoms after taking one of these medicines, seek care immediately if you start to experience difficulty breathing, feeling of your throat closing, facial swelling, or rash as these could be indications of a more serious allergic reaction    We would like you to follow up with your primary care provider within 1-3 days. Return to the ER immediately should you experience any new or worsening symptoms  including but not limited to return of pain, worsened pain, vomiting, shortness of breath, dizziness, lightheadedness, passing out, vomiting coffee-ground contents, inability to keep fluids down, blood in your stool, or any other concerns that you may have.

## 2019-10-30 NOTE — ED Notes (Signed)
Patient verbalizes understanding of discharge instructions. Opportunity for questioning and answers were provided. Armband removed by staff, pt discharged from ED to home 
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
## 2019-10-30 NOTE — ED Triage Notes (Signed)
Pt states that he has been having abd pain and CP for the past 2 days. Reports vomiting dark red blood as well.
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
## 2019-11-08 ENCOUNTER — Other Ambulatory Visit: Payer: Self-pay

## 2019-11-08 ENCOUNTER — Emergency Department (HOSPITAL_COMMUNITY)
Admission: EM | Admit: 2019-11-08 | Discharge: 2019-11-09 | Disposition: A | Discharge status: Home / Self Care | Attending: Emergency Medicine | Admitting: Emergency Medicine

## 2019-11-08 ENCOUNTER — Encounter (HOSPITAL_COMMUNITY): Payer: Self-pay | Admitting: Emergency Medicine

## 2019-11-08 ENCOUNTER — Emergency Department (HOSPITAL_COMMUNITY)

## 2019-11-08 DIAGNOSIS — Y9289 Other specified places as the place of occurrence of the external cause: Secondary | ICD-10-CM | POA: Diagnosis not present

## 2019-11-08 DIAGNOSIS — Y9389 Activity, other specified: Secondary | ICD-10-CM | POA: Insufficient documentation

## 2019-11-08 DIAGNOSIS — F17200 Nicotine dependence, unspecified, uncomplicated: Secondary | ICD-10-CM | POA: Insufficient documentation

## 2019-11-08 DIAGNOSIS — R0789 Other chest pain: Secondary | ICD-10-CM | POA: Diagnosis not present

## 2019-11-08 DIAGNOSIS — R52 Pain, unspecified: Secondary | ICD-10-CM

## 2019-11-08 DIAGNOSIS — Z79899 Other long term (current) drug therapy: Secondary | ICD-10-CM | POA: Insufficient documentation

## 2019-11-08 DIAGNOSIS — J45909 Unspecified asthma, uncomplicated: Secondary | ICD-10-CM | POA: Diagnosis not present

## 2019-11-08 DIAGNOSIS — Y990 Civilian activity done for income or pay: Secondary | ICD-10-CM | POA: Diagnosis not present

## 2019-11-08 DIAGNOSIS — X500XXA Overexertion from strenuous movement or load, initial encounter: Secondary | ICD-10-CM | POA: Diagnosis not present

## 2019-11-08 DIAGNOSIS — R0781 Pleurodynia: Secondary | ICD-10-CM | POA: Insufficient documentation

## 2019-11-08 NOTE — ED Triage Notes (Signed)
Patient reports right lateral ribcage pain onset yesterday , denies injury, respirations unlabored , pain increases with movement . Patient added heavy lifting at work .
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
## 2019-11-25 ENCOUNTER — Other Ambulatory Visit: Payer: Self-pay

## 2019-11-25 ENCOUNTER — Encounter (HOSPITAL_COMMUNITY): Payer: Self-pay

## 2019-11-25 ENCOUNTER — Emergency Department (HOSPITAL_COMMUNITY)
Admission: EM | Admit: 2019-11-25 | Discharge: 2019-11-26 | Disposition: A | Discharge status: Home / Self Care | Payer: Self-pay | Attending: Emergency Medicine | Admitting: Emergency Medicine

## 2019-11-25 DIAGNOSIS — Z5321 Procedure and treatment not carried out due to patient leaving prior to being seen by health care provider: Secondary | ICD-10-CM | POA: Insufficient documentation

## 2019-11-25 DIAGNOSIS — R638 Other symptoms and signs concerning food and fluid intake: Secondary | ICD-10-CM | POA: Insufficient documentation

## 2019-11-25 DIAGNOSIS — R112 Nausea with vomiting, unspecified: Secondary | ICD-10-CM | POA: Insufficient documentation

## 2019-11-25 DIAGNOSIS — F419 Anxiety disorder, unspecified: Secondary | ICD-10-CM | POA: Insufficient documentation

## 2019-11-25 DIAGNOSIS — R0602 Shortness of breath: Secondary | ICD-10-CM | POA: Insufficient documentation

## 2019-11-25 NOTE — ED Triage Notes (Signed)
Pt arrives POV for eval of nausea, vomiting since yesterday, loss of appetite and shortness of breath. Pt reports he feels as though this is attributed to anxiety, which he noted just started. Pt denies CP. In NARD in triage, resps E/U.

## 2019-11-26 NOTE — ED Notes (Signed)
Pt left due to not being seen

## 2019-11-30 ENCOUNTER — Other Ambulatory Visit: Payer: Self-pay

## 2019-11-30 ENCOUNTER — Emergency Department (HOSPITAL_COMMUNITY)
Admission: EM | Admit: 2019-11-30 | Discharge: 2019-11-30 | Disposition: A | Discharge status: Home / Self Care | Attending: Emergency Medicine | Admitting: Emergency Medicine

## 2019-11-30 ENCOUNTER — Encounter (HOSPITAL_COMMUNITY): Payer: Self-pay | Admitting: Emergency Medicine

## 2019-11-30 DIAGNOSIS — F1210 Cannabis abuse, uncomplicated: Secondary | ICD-10-CM | POA: Diagnosis not present

## 2019-11-30 DIAGNOSIS — F12188 Cannabis abuse with other cannabis-induced disorder: Secondary | ICD-10-CM | POA: Diagnosis not present

## 2019-11-30 DIAGNOSIS — F17200 Nicotine dependence, unspecified, uncomplicated: Secondary | ICD-10-CM | POA: Insufficient documentation

## 2019-11-30 DIAGNOSIS — Z7951 Long term (current) use of inhaled steroids: Secondary | ICD-10-CM | POA: Diagnosis not present

## 2019-11-30 DIAGNOSIS — J45909 Unspecified asthma, uncomplicated: Secondary | ICD-10-CM | POA: Insufficient documentation

## 2019-11-30 DIAGNOSIS — R112 Nausea with vomiting, unspecified: Secondary | ICD-10-CM | POA: Diagnosis present

## 2019-11-30 DIAGNOSIS — F1290 Cannabis use, unspecified, uncomplicated: Secondary | ICD-10-CM

## 2019-11-30 LAB — COMPREHENSIVE METABOLIC PANEL
ALT: 32 U/L (ref 0–44)
AST: 46 U/L — ABNORMAL HIGH (ref 15–41)
Albumin: 4.2 g/dL (ref 3.5–5.0)
Alkaline Phosphatase: 53 U/L (ref 38–126)
Anion gap: 12 (ref 5–15)
BUN: 6 mg/dL (ref 6–20)
CO2: 31 mmol/L (ref 22–32)
Calcium: 9.9 mg/dL (ref 8.9–10.3)
Chloride: 97 mmol/L — ABNORMAL LOW (ref 98–111)
Creatinine, Ser: 1.04 mg/dL (ref 0.61–1.24)
GFR calc Af Amer: >60 mL/min (ref >60)
GFR calc non Af Amer: >60 mL/min (ref >60)
Glucose, Bld: 88 mg/dL (ref 70–99)
Potassium: 3.3 mmol/L — ABNORMAL LOW (ref 3.5–5.1)
Sodium: 140 mmol/L (ref 135–145)
Total Bilirubin: 0.9 mg/dL (ref 0.3–1.2)
Total Protein: 7.7 g/dL (ref 6.5–8.1)

## 2019-11-30 LAB — CBC
HCT: 46.8 % (ref 39.0–52.0)
Hemoglobin: 16.1 g/dL (ref 13.0–17.0)
MCH: 30.9 pg (ref 26.0–34.0)
MCHC: 34.4 g/dL (ref 30.0–36.0)
MCV: 89.8 fL (ref 80.0–100.0)
Platelets: 235 10*3/uL (ref 150–400)
RBC: 5.21 MIL/uL (ref 4.22–5.81)
RDW: 12.5 % (ref 11.5–15.5)
WBC: 7.9 10*3/uL (ref 4.0–10.5)
nRBC: 0 % (ref 0.0–0.2)

## 2019-11-30 LAB — LIPASE, BLOOD: Lipase: 20 U/L (ref 11–51)

## 2019-11-30 MED ORDER — SODIUM CHLORIDE 0.9% FLUSH: 3.0000 mL | Freq: Once | INTRAVENOUS | Status: DC

## 2019-11-30 MED ORDER — POTASSIUM CHLORIDE CRYS ER 20 MEQ PO TBCR
20.0000 meq | EXTENDED_RELEASE_TABLET | Freq: Once | ORAL | Status: AC
  Administered 2019-11-30: 20 meq via ORAL
  Filled 2019-11-30: qty 1
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
## 2019-11-30 NOTE — Discharge Instructions (Addendum)
You were evaluated in the emergency department due to vomiting after stopping the drug "K2".  In the emergency department we discussed the fact that you had been able to keep down food and some liquids.  Since you are able to keep down liquids I feel comfortable with discharge home.  I do think it is important to maintain good fluid status by drinking plenty of water.  I recommend that you return if you are unable to keep down liquids, if you develop shortness of breath, chest pain, fevers.

## 2019-11-30 NOTE — ED Notes (Signed)
Patient verbalizes understanding of discharge instructions. Opportunity for questioning and answers were provided. Pt discharged from ED. 

## 2019-11-30 NOTE — ED Triage Notes (Signed)
EMS stated, pt has been doing/smoking SPICE  For 4 days, and decided to quit cold [place] and started throwing up yesterday and has thrown up 1 time today.

## 2019-12-05 ENCOUNTER — Other Ambulatory Visit: Payer: Self-pay

## 2019-12-05 ENCOUNTER — Emergency Department (HOSPITAL_COMMUNITY)
Admission: EM | Admit: 2019-12-05 | Discharge: 2019-12-06 | Disposition: A | Discharge status: Home / Self Care | Attending: Emergency Medicine | Admitting: Emergency Medicine

## 2019-12-05 ENCOUNTER — Encounter (HOSPITAL_COMMUNITY): Payer: Self-pay | Admitting: Emergency Medicine

## 2019-12-05 DIAGNOSIS — J45909 Unspecified asthma, uncomplicated: Secondary | ICD-10-CM | POA: Insufficient documentation

## 2019-12-05 DIAGNOSIS — Z609 Problem related to social environment, unspecified: Secondary | ICD-10-CM | POA: Diagnosis not present

## 2019-12-05 DIAGNOSIS — Z7951 Long term (current) use of inhaled steroids: Secondary | ICD-10-CM | POA: Insufficient documentation

## 2019-12-05 DIAGNOSIS — F17210 Nicotine dependence, cigarettes, uncomplicated: Secondary | ICD-10-CM | POA: Diagnosis not present

## 2019-12-05 DIAGNOSIS — Z659 Problem related to unspecified psychosocial circumstances: Secondary | ICD-10-CM

## 2019-12-05 DIAGNOSIS — K922 Gastrointestinal hemorrhage, unspecified: Secondary | ICD-10-CM | POA: Diagnosis not present

## 2019-12-05 LAB — CBC
HCT: 44.7 % (ref 39.0–52.0)
Hemoglobin: 15.3 g/dL (ref 13.0–17.0)
MCH: 30.8 pg (ref 26.0–34.0)
MCHC: 34.2 g/dL (ref 30.0–36.0)
MCV: 89.9 fL (ref 80.0–100.0)
Platelets: 300 10*3/uL (ref 150–400)
RBC: 4.97 MIL/uL (ref 4.22–5.81)
RDW: 12.4 % (ref 11.5–15.5)
WBC: 11.3 10*3/uL — ABNORMAL HIGH (ref 4.0–10.5)
nRBC: 0 % (ref 0.0–0.2)

## 2019-12-05 LAB — COMPREHENSIVE METABOLIC PANEL
ALT: 31 U/L (ref 0–44)
AST: 28 U/L (ref 15–41)
Albumin: 5.2 g/dL — ABNORMAL HIGH (ref 3.5–5.0)
Alkaline Phosphatase: 49 U/L (ref 38–126)
Anion gap: 13 (ref 5–15)
BUN: 10 mg/dL (ref 6–20)
CO2: 24 mmol/L (ref 22–32)
Calcium: 9.7 mg/dL (ref 8.9–10.3)
Chloride: 100 mmol/L (ref 98–111)
Creatinine, Ser: 1.15 mg/dL (ref 0.61–1.24)
GFR calc Af Amer: >60 mL/min (ref >60)
GFR calc non Af Amer: >60 mL/min (ref >60)
Glucose, Bld: 102 mg/dL — ABNORMAL HIGH (ref 70–99)
Potassium: 3.3 mmol/L — ABNORMAL LOW (ref 3.5–5.1)
Sodium: 137 mmol/L (ref 135–145)
Total Bilirubin: 0.7 mg/dL (ref 0.3–1.2)
Total Protein: 8.2 g/dL — ABNORMAL HIGH (ref 6.5–8.1)

## 2019-12-05 NOTE — ED Triage Notes (Signed)
Pt presents to ED BIB [facility]. Pt c/o GI bleeding. Pt reports bright red blood in stol. Denies black stool. Pt reports loose stools. Denies pain. Pt NAD

## 2019-12-06 NOTE — ED Notes (Signed)
Pt outside.  
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
## 2019-12-07 ENCOUNTER — Emergency Department (HOSPITAL_COMMUNITY)

## 2019-12-07 ENCOUNTER — Encounter (HOSPITAL_COMMUNITY): Payer: Self-pay | Admitting: Emergency Medicine

## 2019-12-07 ENCOUNTER — Other Ambulatory Visit: Payer: Self-pay

## 2019-12-07 ENCOUNTER — Inpatient Hospital Stay (HOSPITAL_COMMUNITY)
Admission: EM | Admit: 2019-12-07 | Discharge: 2019-12-12 | DRG: 918 | Disposition: A | Discharge status: Home / Self Care | Attending: Internal Medicine | Admitting: Internal Medicine

## 2019-12-07 DIAGNOSIS — J45909 Unspecified asthma, uncomplicated: Secondary | ICD-10-CM | POA: Diagnosis present

## 2019-12-07 DIAGNOSIS — I10 Essential (primary) hypertension: Secondary | ICD-10-CM | POA: Diagnosis present

## 2019-12-07 DIAGNOSIS — T1491XA Suicide attempt, initial encounter: Secondary | ICD-10-CM

## 2019-12-07 DIAGNOSIS — F329 Major depressive disorder, single episode, unspecified: Secondary | ICD-10-CM | POA: Diagnosis present

## 2019-12-07 DIAGNOSIS — F419 Anxiety disorder, unspecified: Secondary | ICD-10-CM | POA: Diagnosis present

## 2019-12-07 DIAGNOSIS — F1010 Alcohol abuse, uncomplicated: Secondary | ICD-10-CM | POA: Diagnosis present

## 2019-12-07 DIAGNOSIS — Z818 Family history of other mental and behavioral disorders: Secondary | ICD-10-CM | POA: Diagnosis not present

## 2019-12-07 DIAGNOSIS — E876 Hypokalemia: Secondary | ICD-10-CM | POA: Diagnosis present

## 2019-12-07 DIAGNOSIS — T391X1A Poisoning by 4-Aminophenol derivatives, accidental (unintentional), initial encounter: Secondary | ICD-10-CM | POA: Diagnosis present

## 2019-12-07 DIAGNOSIS — T391X2A Poisoning by 4-Aminophenol derivatives, intentional self-harm, initial encounter: Principal | ICD-10-CM

## 2019-12-07 DIAGNOSIS — F17210 Nicotine dependence, cigarettes, uncomplicated: Secondary | ICD-10-CM | POA: Diagnosis present

## 2019-12-07 DIAGNOSIS — Z20822 Contact with and (suspected) exposure to covid-19: Secondary | ICD-10-CM | POA: Diagnosis present

## 2019-12-07 DIAGNOSIS — R1110 Vomiting, unspecified: Secondary | ICD-10-CM | POA: Diagnosis present

## 2019-12-07 DIAGNOSIS — T450X1A Poisoning by antiallergic and antiemetic drugs, accidental (unintentional), initial encounter: Secondary | ICD-10-CM | POA: Diagnosis present

## 2019-12-07 DIAGNOSIS — Y92009 Unspecified place in unspecified non-institutional (private) residence as the place of occurrence of the external cause: Secondary | ICD-10-CM | POA: Diagnosis not present

## 2019-12-07 DIAGNOSIS — R519 Headache, unspecified: Secondary | ICD-10-CM | POA: Diagnosis not present

## 2019-12-07 DIAGNOSIS — T65292A Toxic effect of other tobacco and nicotine, intentional self-harm, initial encounter: Secondary | ICD-10-CM | POA: Diagnosis present

## 2019-12-07 LAB — CBC WITH DIFFERENTIAL/PLATELET
Abs Immature Granulocytes: 0.03 10*3/uL (ref 0.00–0.07)
Basophils Absolute: 0.1 10*3/uL (ref 0.0–0.1)
Basophils Relative: 1 %
Eosinophils Absolute: 0 10*3/uL (ref 0.0–0.5)
Eosinophils Relative: 0 %
HCT: 44.6 % (ref 39.0–52.0)
Hemoglobin: 15.6 g/dL (ref 13.0–17.0)
Immature Granulocytes: 0 %
Lymphocytes Relative: 19 %
Lymphs Abs: 2.1 10*3/uL (ref 0.7–4.0)
MCH: 31.1 pg (ref 26.0–34.0)
MCHC: 35 g/dL (ref 30.0–36.0)
MCV: 88.8 fL (ref 80.0–100.0)
Monocytes Absolute: 0.5 10*3/uL (ref 0.1–1.0)
Monocytes Relative: 5 %
Neutro Abs: 8.3 10*3/uL — ABNORMAL HIGH (ref 1.7–7.7)
Neutrophils Relative %: 75 %
Platelets: 258 10*3/uL (ref 150–400)
RBC: 5.02 MIL/uL (ref 4.22–5.81)
RDW: 12.7 % (ref 11.5–15.5)
WBC: 11 10*3/uL — ABNORMAL HIGH (ref 4.0–10.5)
nRBC: 0 % (ref 0.0–0.2)

## 2019-12-07 LAB — SARS CORONAVIRUS 2 BY RT PCR (HOSPITAL ORDER, PERFORMED IN ~~LOC~~ HOSPITAL LAB): SARS Coronavirus 2: NEGATIVE

## 2019-12-07 LAB — COMPREHENSIVE METABOLIC PANEL
ALT: 27 U/L (ref 0–44)
AST: 21 U/L (ref 15–41)
Albumin: 4.7 g/dL (ref 3.5–5.0)
Alkaline Phosphatase: 55 U/L (ref 38–126)
Anion gap: 14 (ref 5–15)
BUN: 10 mg/dL (ref 6–20)
CO2: 27 mmol/L (ref 22–32)
Calcium: 9.4 mg/dL (ref 8.9–10.3)
Chloride: 102 mmol/L (ref 98–111)
Creatinine, Ser: 1.08 mg/dL (ref 0.61–1.24)
GFR calc Af Amer: >60 mL/min (ref >60)
GFR calc non Af Amer: >60 mL/min (ref >60)
Glucose, Bld: 80 mg/dL (ref 70–99)
Potassium: 3.5 mmol/L (ref 3.5–5.1)
Sodium: 143 mmol/L (ref 135–145)
Total Bilirubin: 0.6 mg/dL (ref 0.3–1.2)
Total Protein: 8.1 g/dL (ref 6.5–8.1)

## 2019-12-07 LAB — PROTIME-INR
INR: 1.1 (ref 0.8–1.2)
Prothrombin Time: 14.1 seconds (ref 11.4–15.2)

## 2019-12-07 LAB — ACETAMINOPHEN LEVEL
Acetaminophen (Tylenol), Serum: 71 ug/mL — ABNORMAL HIGH (ref 10–30)
Acetaminophen (Tylenol), Serum: 166 ug/mL (ref 10–30)

## 2019-12-07 LAB — RAPID URINE DRUG SCREEN, HOSP PERFORMED
Amphetamines: NOT DETECTED
Barbiturates: NOT DETECTED
Benzodiazepines: NOT DETECTED
Cocaine: NOT DETECTED
Opiates: NOT DETECTED
Tetrahydrocannabinol: POSITIVE — AB

## 2019-12-07 LAB — ETHANOL: Alcohol, Ethyl (B): <10 mg/dL (ref <10)

## 2019-12-07 LAB — SALICYLATE LEVEL: Salicylate Lvl: <7 mg/dL — ABNORMAL LOW (ref 7.0–30.0)

## 2019-12-07 MED ORDER — ONDANSETRON HCL 4 MG/2ML IJ SOLN
4.0000 mg | Freq: Once | INTRAMUSCULAR | Status: AC
  Administered 2019-12-07: 4 mg via INTRAVENOUS
  Filled 2019-12-07: qty 2

## 2019-12-07 MED ORDER — ACETYLCYSTEINE LOAD VIA INFUSION
150.0000 mg/kg | Freq: Once | INTRAVENOUS | Status: AC
  Administered 2019-12-07: 15000 mg via INTRAVENOUS
  Filled 2019-12-07: qty 375

## 2019-12-07 MED ORDER — LACTATED RINGERS IV BOLUS
1000.0000 mL | Freq: Once | INTRAVENOUS | Status: AC
  Administered 2019-12-07: 1000 mL via INTRAVENOUS

## 2019-12-07 MED ORDER — DEXTROSE 5 % IV SOLN
15.0000 mg/kg/h | INTRAVENOUS | Status: DC
  Administered 2019-12-08 (×2): 15 mg/kg/h via INTRAVENOUS
  Filled 2019-12-07 (×4): qty 120

## 2019-12-07 NOTE — ED Triage Notes (Signed)
Arrives via EMS from sidewalk in front of his house. C/C overdose. States he took 50 Tylenol PM and 10 nicotine patches around 4:15pm, states he has thrown up multiple times since then. Has had SI thoughts x1 week. Denies pain.
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
## 2019-12-07 NOTE — ED Notes (Signed)
Date and time results received: 12/07/19 2106 (use smartphrase ".now" to insert current time)  Test: Acetaminophen  Critical Value: 166  Name of Provider Notified: Rubin Payor, MD   Orders Received? Or Actions Taken?: Orders Received - See Orders for details    Results by [facility]

## 2019-12-07 NOTE — ED Notes (Signed)
Poison control updated on patient labs and condition. Case already opened. Spoke with Angelique Blonder, RN
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
## 2019-12-07 NOTE — ED Notes (Signed)
HR noted in the 140s-150s, MD notified and another EKG captured.

## 2019-12-08 DIAGNOSIS — F1910 Other psychoactive substance abuse, uncomplicated: Secondary | ICD-10-CM

## 2019-12-08 DIAGNOSIS — E876 Hypokalemia: Secondary | ICD-10-CM

## 2019-12-08 LAB — COMPREHENSIVE METABOLIC PANEL
ALT: 39 U/L (ref 0–44)
AST: 29 U/L (ref 15–41)
Albumin: 3.7 g/dL (ref 3.5–5.0)
Alkaline Phosphatase: 46 U/L (ref 38–126)
Anion gap: 11 (ref 5–15)
BUN: 8 mg/dL (ref 6–20)
CO2: 25 mmol/L (ref 22–32)
Calcium: 9 mg/dL (ref 8.9–10.3)
Chloride: 104 mmol/L (ref 98–111)
Creatinine, Ser: 1.09 mg/dL (ref 0.61–1.24)
GFR calc Af Amer: >60 mL/min (ref >60)
GFR calc non Af Amer: >60 mL/min (ref >60)
Glucose, Bld: 96 mg/dL (ref 70–99)
Potassium: 3.6 mmol/L (ref 3.5–5.1)
Sodium: 140 mmol/L (ref 135–145)
Total Bilirubin: 0.8 mg/dL (ref 0.3–1.2)
Total Protein: 6.8 g/dL (ref 6.5–8.1)

## 2019-12-08 LAB — CBC
HCT: 45.8 % (ref 39.0–52.0)
Hemoglobin: 15.8 g/dL (ref 13.0–17.0)
MCH: 30.9 pg (ref 26.0–34.0)
MCHC: 34.5 g/dL (ref 30.0–36.0)
MCV: 89.5 fL (ref 80.0–100.0)
Platelets: 285 10*3/uL (ref 150–400)
RBC: 5.12 MIL/uL (ref 4.22–5.81)
RDW: 12.5 % (ref 11.5–15.5)
WBC: 10.9 10*3/uL — ABNORMAL HIGH (ref 4.0–10.5)
nRBC: 0 % (ref 0.0–0.2)

## 2019-12-08 LAB — BASIC METABOLIC PANEL
Anion gap: 11 (ref 5–15)
BUN: 8 mg/dL (ref 6–20)
CO2: 26 mmol/L (ref 22–32)
Calcium: 9.4 mg/dL (ref 8.9–10.3)
Chloride: 100 mmol/L (ref 98–111)
Creatinine, Ser: 1.01 mg/dL (ref 0.61–1.24)
GFR calc Af Amer: >60 mL/min (ref >60)
GFR calc non Af Amer: >60 mL/min (ref >60)
Glucose, Bld: 129 mg/dL — ABNORMAL HIGH (ref 70–99)
Potassium: 3.5 mmol/L (ref 3.5–5.1)
Sodium: 137 mmol/L (ref 135–145)

## 2019-12-08 LAB — PROTIME-INR
INR: 1.3 — ABNORMAL HIGH (ref 0.8–1.2)
Prothrombin Time: 16.1 seconds — ABNORMAL HIGH (ref 11.4–15.2)

## 2019-12-08 LAB — ACETAMINOPHEN LEVEL
Acetaminophen (Tylenol), Serum: 58 ug/mL — ABNORMAL HIGH (ref 10–30)
Acetaminophen (Tylenol), Serum: <10 ug/mL — ABNORMAL LOW (ref 10–30)

## 2019-12-08 LAB — GLUCOSE, CAPILLARY
Glucose-Capillary: 139 mg/dL — ABNORMAL HIGH (ref 70–99)
Glucose-Capillary: 82 mg/dL (ref 70–99)

## 2019-12-08 LAB — TSH: TSH: 0.527 u[IU]/mL (ref 0.350–4.500)

## 2019-12-08 LAB — MRSA PCR SCREENING: MRSA by PCR: NEGATIVE

## 2019-12-08 LAB — HIV ANTIBODY (ROUTINE TESTING W REFLEX): HIV Screen 4th Generation wRfx: NONREACTIVE

## 2019-12-08 MED ORDER — ONDANSETRON HCL 4 MG/2ML IJ SOLN
4.0000 mg | Freq: Once | INTRAMUSCULAR | Status: AC
  Administered 2019-12-08: 4 mg via INTRAVENOUS
  Filled 2019-12-08: qty 2

## 2019-12-08 MED ORDER — HYDRALAZINE HCL 10 MG PO TABS
10.0000 mg | ORAL_TABLET | Freq: Three times a day (TID) | ORAL | Status: DC
  Administered 2019-12-08 – 2019-12-12 (×12): 10 mg via ORAL
  Filled 2019-12-08 (×13): qty 1

## 2019-12-08 MED ORDER — HYDROXYZINE HCL 10 MG PO TABS
10.0000 mg | ORAL_TABLET | Freq: Every evening | ORAL | Status: DC | PRN
  Administered 2019-12-08 – 2019-12-09 (×2): 10 mg via ORAL
  Filled 2019-12-08 (×3): qty 1

## 2019-12-08 MED ORDER — PANTOPRAZOLE SODIUM 20 MG PO TBEC
20.0000 mg | DELAYED_RELEASE_TABLET | Freq: Every day | ORAL | Status: DC
  Administered 2019-12-08 – 2019-12-12 (×4): 20 mg via ORAL
  Filled 2019-12-08 (×5): qty 1

## 2019-12-08 MED ORDER — SENNOSIDES-DOCUSATE SODIUM 8.6-50 MG PO TABS
1.0000 | ORAL_TABLET | Freq: Two times a day (BID) | ORAL | Status: DC
  Administered 2019-12-08 – 2019-12-11 (×5): 1 via ORAL
  Filled 2019-12-08 (×8): qty 1

## 2019-12-08 MED ORDER — MAGNESIUM SULFATE 2 GM/50ML IV SOLN
2.0000 g | Freq: Once | INTRAVENOUS | Status: DC
  Filled 2019-12-08: qty 50

## 2019-12-08 MED ORDER — SODIUM CHLORIDE 0.9 % IV SOLN: INTRAVENOUS | Status: AC

## 2019-12-08 MED ORDER — POTASSIUM CHLORIDE CRYS ER 20 MEQ PO TBCR
40.0000 meq | EXTENDED_RELEASE_TABLET | Freq: Once | ORAL | Status: AC
  Administered 2019-12-08: 40 meq via ORAL
  Filled 2019-12-08: qty 2

## 2019-12-08 MED ORDER — ONDANSETRON HCL 4 MG/2ML IJ SOLN: 4.0000 mg | Freq: Three times a day (TID) | INTRAMUSCULAR | Status: DC | PRN

## 2019-12-08 MED ORDER — ORAL CARE MOUTH RINSE: 15.0000 mL | Freq: Two times a day (BID) | OROMUCOSAL | Status: DC

## 2019-12-08 MED ORDER — DIPHENHYDRAMINE HCL 50 MG/ML IJ SOLN
25.0000 mg | Freq: Once | INTRAMUSCULAR | Status: AC
  Administered 2019-12-08: 25 mg via INTRAVENOUS
  Filled 2019-12-08: qty 1

## 2019-12-08 MED ORDER — HYDROXYZINE HCL 10 MG PO TABS
10.0000 mg | ORAL_TABLET | Freq: Three times a day (TID) | ORAL | Status: DC | PRN
  Administered 2019-12-08 – 2019-12-12 (×4): 10 mg via ORAL
  Filled 2019-12-08 (×4): qty 1

## 2019-12-08 MED ORDER — CHLORHEXIDINE GLUCONATE CLOTH 2 % EX PADS
6.0000 | MEDICATED_PAD | Freq: Every day | CUTANEOUS | Status: DC
  Administered 2019-12-08: 6 via TOPICAL
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
## 2019-12-08 NOTE — TOC Progression Note (Signed)
Transition of Care [facility]) - Progression Note    Patient Details  Name: Reginald Ballard MRN: [id number] Date of Birth: [birth year]  Transition of Care [facility]) CM/SW Contact  Armanda Heritage, RN Phone Number: 12/08/2019, 4:04 PM  Clinical Narrative:    IVC paperwork completed and custody order received from magistrate.     Expected Discharge Plan: Psychiatric Hospital Barriers to Discharge: Continued Medical Work up  Expected Discharge Plan and Services Expected Discharge Plan: Psychiatric Hospital   Discharge Planning Services: CM Consult   Living arrangements for the past 2 months: Apartment                                       Social Determinants of Health (SDOH) Interventions    Readmission Risk Interventions No flowsheet data found.
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
## 2019-12-08 NOTE — Progress Notes (Signed)
Called Dr. Roda Shutters and Rosario Adie NP about patient wanting to leave. Per Rosario Adie, patient to be IVCed and notified Lafonda Mosses with Case Management about starting paperwork. Will continue to monitor

## 2019-12-09 DIAGNOSIS — D72829 Elevated white blood cell count, unspecified: Secondary | ICD-10-CM

## 2019-12-09 LAB — HEMOGLOBIN A1C
Hgb A1c MFr Bld: 5.4 % (ref 4.8–5.6)
Mean Plasma Glucose: 108.28 mg/dL

## 2019-12-09 LAB — CBC WITH DIFFERENTIAL/PLATELET
Abs Immature Granulocytes: 0.02 10*3/uL (ref 0.00–0.07)
Basophils Absolute: 0 10*3/uL (ref 0.0–0.1)
Basophils Relative: 1 %
Eosinophils Absolute: 0 10*3/uL (ref 0.0–0.5)
Eosinophils Relative: 1 %
HCT: 43.9 % (ref 39.0–52.0)
Hemoglobin: 15 g/dL (ref 13.0–17.0)
Immature Granulocytes: 0 %
Lymphocytes Relative: 21 %
Lymphs Abs: 1.9 10*3/uL (ref 0.7–4.0)
MCH: 30.9 pg (ref 26.0–34.0)
MCHC: 34.2 g/dL (ref 30.0–36.0)
MCV: 90.3 fL (ref 80.0–100.0)
Monocytes Absolute: 0.5 10*3/uL (ref 0.1–1.0)
Monocytes Relative: 5 %
Neutro Abs: 6.3 10*3/uL (ref 1.7–7.7)
Neutrophils Relative %: 72 %
Platelets: 247 10*3/uL (ref 150–400)
RBC: 4.86 MIL/uL (ref 4.22–5.81)
RDW: 13 % (ref 11.5–15.5)
WBC: 8.7 10*3/uL (ref 4.0–10.5)
nRBC: 0 % (ref 0.0–0.2)

## 2019-12-09 LAB — COMPREHENSIVE METABOLIC PANEL
ALT: 42 U/L (ref 0–44)
AST: 33 U/L (ref 15–41)
Albumin: 3.9 g/dL (ref 3.5–5.0)
Alkaline Phosphatase: 43 U/L (ref 38–126)
Anion gap: 10 (ref 5–15)
BUN: 9 mg/dL (ref 6–20)
CO2: 25 mmol/L (ref 22–32)
Calcium: 9.1 mg/dL (ref 8.9–10.3)
Chloride: 105 mmol/L (ref 98–111)
Creatinine, Ser: 0.9 mg/dL (ref 0.61–1.24)
GFR calc Af Amer: >60 mL/min (ref >60)
GFR calc non Af Amer: >60 mL/min (ref >60)
Glucose, Bld: 111 mg/dL — ABNORMAL HIGH (ref 70–99)
Potassium: 3.6 mmol/L (ref 3.5–5.1)
Sodium: 140 mmol/L (ref 135–145)
Total Bilirubin: 0.7 mg/dL (ref 0.3–1.2)
Total Protein: 6.7 g/dL (ref 6.5–8.1)

## 2019-12-09 LAB — MAGNESIUM: Magnesium: 2.1 mg/dL (ref 1.7–2.4)

## 2019-12-09 LAB — PROTIME-INR
INR: 1.4 — ABNORMAL HIGH (ref 0.8–1.2)
Prothrombin Time: 16.2 seconds — ABNORMAL HIGH (ref 11.4–15.2)

## 2019-12-09 MED ORDER — SERTRALINE HCL 25 MG PO TABS
25.0000 mg | ORAL_TABLET | Freq: Every day | ORAL | Status: DC
  Administered 2019-12-09 – 2019-12-12 (×4): 25 mg via ORAL
  Filled 2019-12-09 (×4): qty 1

## 2019-12-09 MED ORDER — ARIPIPRAZOLE 2 MG PO TABS
2.0000 mg | ORAL_TABLET | Freq: Every day | ORAL | Status: DC
  Administered 2019-12-09 – 2019-12-12 (×4): 2 mg via ORAL
  Filled 2019-12-09 (×5): qty 1

## 2019-12-09 NOTE — Progress Notes (Signed)
Patient transferred from stepdown with a IVC sitter. Alert and oriented x4. Sitter at bedside.

## 2019-12-09 NOTE — Progress Notes (Signed)
Patient has one bag of belongings that were brought up by ICU. They have been placed in the conference room on [address] ( on the counter in the clear tote labeled patient belonging).

## 2019-12-09 NOTE — Progress Notes (Signed)
PHARMACY - ACETYLCYSTEINE  IV acetylcysteine started 12/07/2019 @ 23:10 for acetaminophen overdose  12/08/19 22:00 labs as follows: APAP < 10 mcg/ml PT/INR 16.1/1.3 AST/ALT = 28/31  Assessment:  Labs called to [facility] and spoke with Alexia Freestone, RN  Plan: Poison Control Center recommended OK to discontinue IV acetylcysteine at this time.  Terrilee Files, PharmD 12/09/19 @ 00:36
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
## 2019-12-09 NOTE — Progress Notes (Signed)
Patient's vital signs were stable other than his blood pressure. RN educated the patient about his taking his medication to help with the blood pressure. Patient refused to take his medication and is currently sitting in the window seal with the sitter at bedside.

## 2019-12-09 NOTE — Social Work (Signed)
CSW faxed out referrals to the following inpatient facilities: [facility]  Shella Spearing MSW LCSWA Transitions of Care  Clinical Social Worker  [facility]  [phone]
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
## 2019-12-10 DIAGNOSIS — R519 Headache, unspecified: Secondary | ICD-10-CM

## 2019-12-10 DIAGNOSIS — I10 Essential (primary) hypertension: Secondary | ICD-10-CM

## 2019-12-10 MED ORDER — IBUPROFEN 200 MG PO TABS
200.0000 mg | ORAL_TABLET | Freq: Four times a day (QID) | ORAL | Status: DC | PRN
  Administered 2019-12-10 – 2019-12-12 (×6): 200 mg via ORAL
  Filled 2019-12-10 (×6): qty 1
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
## 2019-12-10 NOTE — Plan of Care (Signed)
Plan of care discussed.   

## 2019-12-10 NOTE — TOC Progression Note (Signed)
Transition of Care [facility]) - Progression Note    Patient Details  Name: Reginald Ballard MRN: [id number] Date of Birth: 09-21-1990  Transition of [facility]) CM/SW Contact  Elliot Gault, LCSW Phone Number: 12/10/2019, 2:41 PM  Clinical Narrative:     TOC following. Pt has been faxed out for behavioral health hospital placement. Pt does not have health insurance. Have not received any placement options as of yet. Spoke with Behavioral Health intake for [facility] and also [facility] to inquire on placement. Awaiting return call from each facility. Will follow and assist with dc once placement available.  Expected Discharge Plan: Psychiatric Hospital Barriers to Discharge: Continued Medical Work up  Expected Discharge Plan and Services Expected Discharge Plan: Psychiatric Hospital   Discharge Planning Services: CM Consult   Living arrangements for the past 2 months: Apartment                                       Social Determinants of Health (SDOH) Interventions    Readmission Risk Interventions No flowsheet data found.

## 2019-12-10 NOTE — Progress Notes (Signed)
PROGRESS NOTE    Reginald Ballard  FXT:[id number]  DOB: February 17, 1991  PCP: Patient, No Pcp Per Admit date:12/07/2019 Chief compliant: Medication overdose 29 year old male presents to the ED after intentional overdosed with Tylenol, Benadryl and ?  Nicotine patch.Patient states he was depressed and took about 50 tablets of Tylenol PM . Shortly after which he called for help and was brought to the ER. Patient also was noted that he took some nicotine patch. Denies taking any other medications. ED Course:In the ER patient was tachycardic and lab work showed normal complete metabolic panel WBC count was 11,000 EKG was showing sinus tachycardia initial acetaminophen level was 71 and second one was 160. INR is normal. Poison control advised to start patient on N-acetylcysteine. Patient's urine drug screen is positive for marijuana. Covid test is negative.  Hospital course: Patient admitted to [facility] for further evaluation and management.   Subjective: Denies any acute complaints except for headache and requesting pain medication.  Eating well and slept well.  Sitter at bedside  Objective: Vitals:   12/09/19 2252 12/10/19 0024 12/10/19 0544 12/10/19 1254  BP: (!) 133/95 (!) 155/94 (!) 137/96 140/84  Pulse: 84 85 100 71  Resp: 20 20 20 15   Temp: 99 F (37.2 C) 98.7 F (37.1 C) 99.4 F (37.4 C)   TempSrc: Oral Oral Oral   SpO2: 97% 98% 96% 99%  Weight:      Height:        Intake/Output Summary (Last 24 hours) at 12/10/2019 1641 Last data filed at 12/10/2019 0600 Gross per 24 hour  Intake 1300 ml  Output --  Net 1300 ml   Filed Weights   12/07/19 1739  Weight: 100 kg    Physical Examination: General: Thin built male, no acute distress noted Head ENT: Atraumatic normocephalic, PERRLA, neck supple Heart: S1-S2 heard, regular rate and rhythm, no murmurs.  No leg edema noted Lungs: Equal air entry bilaterally, no rhonchi or rales on exam, no accessory muscle use Abdomen: Bowel  sounds heard, soft, nontender, nondistended. No organomegaly.  No CVA tenderness Extremities: No pedal edema.  No cyanosis or clubbing. Neurological: Awake alert oriented x3, no focal weakness or numbness, strength and sensations to crude touch intact Skin: No wounds or rashes.   Data Reviewed: I have personally reviewed following labs and imaging studies  CBC: Recent Labs  Lab 12/05/19 2312 12/07/19 1735 12/08/19 0227 12/09/19 0127  WBC 11.3* 11.0* 10.9* 8.7  NEUTROABS  --  8.3*  --  6.3  HGB 15.3 15.6 15.8 15.0  HCT 44.7 44.6 45.8 43.9  MCV 89.9 88.8 89.5 90.3  PLT 300 258 285 247   Basic Metabolic Panel: Recent Labs  Lab 12/05/19 2312 12/07/19 1735 12/08/19 0227 12/08/19 2238 12/09/19 0127  NA 137 143 137 140 140  K 3.3* 3.5 3.5 3.6 3.6  CL 100 102 100 104 105  CO2 24 27 26 25 25   GLUCOSE 102* 80 129* 96 111*  BUN 10 10 8 8 9   CREATININE 1.15 1.08 1.01 1.09 0.90  CALCIUM 9.7 9.4 9.4 9.0 9.1  MG  --   --   --   --  2.1   GFR: Estimated Creatinine Clearance: 147.3 mL/min (by C-G formula based on SCr of 0.9 mg/dL). Liver Function Tests: Recent Labs  Lab 12/05/19 2312 12/07/19 1735 12/08/19 2238 12/09/19 0127  AST 28 21 29  33  ALT 31 27 39 42  ALKPHOS 49 55 46 43  BILITOT 0.7 0.6 0.8 0.7  PROT 8.2* 8.1 6.8 6.7  ALBUMIN 5.2* 4.7 3.7 3.9   No results for input(s): LIPASE, AMYLASE in the last 168 hours. No results for input(s): AMMONIA in the last 168 hours. Coagulation Profile: Recent Labs  Lab 12/07/19 1735 12/08/19 2238 12/09/19 0127  INR 1.1 1.3* 1.4*   Cardiac Enzymes: No results for input(s): CKTOTAL, CKMB, CKMBINDEX, TROPONINI in the last 168 hours. BNP (last 3 results) No results for input(s): PROBNP in the last 8760 hours. HbA1C: Recent Labs    12/09/19 0127  HGBA1C 5.4   CBG: Recent Labs  Lab 12/08/19 0145 12/08/19 0927  GLUCAP 139* 82   Lipid Profile: No results for input(s): CHOL, HDL, LDLCALC, TRIG, CHOLHDL, LDLDIRECT in the  last 72 hours. Thyroid Function Tests: Recent Labs    12/08/19 2238  TSH 0.527   Anemia Panel: No results for input(s): VITAMINB12, FOLATE, FERRITIN, TIBC, IRON, RETICCTPCT in the last 72 hours. Sepsis Labs: No results for input(s): PROCALCITON, LATICACIDVEN in the last 168 hours.  Recent Results (from the past 240 hour(s))  SARS Coronavirus 2 by RT PCR (hospital order, performed in [facility] hospital lab) Nasopharyngeal Nasopharyngeal Swab     Status: None   Collection Time: 12/07/19  5:35 PM   Specimen: Nasopharyngeal Swab  Result Value Ref Range Status   SARS Coronavirus 2 NEGATIVE NEGATIVE Final    Comment: (NOTE) SARS-CoV-2 target nucleic acids are NOT DETECTED.  The SARS-CoV-2 RNA is generally detectable in upper and lower respiratory specimens during the acute phase of infection. The lowest concentration of SARS-CoV-2 viral copies this assay can detect is 250 copies / mL. A negative result does not preclude SARS-CoV-2 infection and should not be used as the sole basis for treatment or other patient management decisions.  A negative result may occur with improper specimen collection / handling, submission of specimen other than nasopharyngeal swab, presence of viral mutation(s) within the areas targeted by this assay, and inadequate number of viral copies (<250 copies / mL). A negative result must be combined with clinical observations, patient history, and epidemiological information.  Fact Sheet for Patients:   BoilerBrush.com.cy  Fact Sheet for Healthcare Providers: https://pope.com/  This test is not yet approved or  cleared by the [place] FDA and has been authorized for detection and/or diagnosis of SARS-CoV-2 by FDA under an Emergency Use Authorization (EUA).  This EUA will remain in effect (meaning this test can be used) for the duration of the COVID-19 declaration under Section 564(b)(1) of the Act, 21  U.S.C. section 360bbb-3(b)(1), unless the authorization is terminated or revoked sooner.  Performed at [facility], [address], [place], [place] [address]   MRSA PCR Screening     Status: None   Collection Time: 12/08/19  1:06 AM   Specimen: Nasal Mucosa; Nasopharyngeal  Result Value Ref Range Status   MRSA by PCR NEGATIVE NEGATIVE Final    Comment:        The GeneXpert MRSA Assay (FDA approved for NASAL specimens only), is one component of a comprehensive MRSA colonization surveillance program. It is not intended to diagnose MRSA infection nor to guide or monitor treatment for MRSA infections. Performed at [facility], [address], [place], [place] [address]       Radiology Studies: No results found.    Scheduled Meds: . ARIPiprazole  2 mg Oral Daily  . Chlorhexidine Gluconate Cloth  6 each Topical Daily  . hydrALAZINE  10 mg Oral Q8H  .  mouth rinse  15 mL Mouth Rinse BID  . pantoprazole  20 mg Oral Daily  . senna-docusate  1 tablet Oral BID  . sertraline  25 mg Oral Daily   Continuous Infusions:   Assessment/Plan:   1.  Intentional acetaminophen/Benadryl overdose: Received N-acetylcysteine for 24 hours.  LFTs, INR closely monitored and normalized.  2.  Depression/anxiety, suicidal ideation: TSH 0.5, psychiatry recommended IVC/inpatient psych placement.  Resumed home medications.  Hydroxyzine as needed for anxiety available.  Medically cleared, awaiting psych bed and sitter at bedside  3.  Hypertension: Noted to have elevated blood pressures on presentation.  No prior diagnosis.  Started on hydralazine 10 mg 3 times daily.  4.  Alcohol abuse: Alcohol level less than 10 on presentation.  No signs of withdrawal so far.  5.  Reactive leukocytosis/hypokalemia: Now resolved.  6.  Asthma, tobacco use: MDI as needed.  Counseled to quit tobacco use.  There was some concern about nicotine patch over use on admission.   Currently no signs of withdrawal.  Monitor.  7.  Headache: Avoid acetaminophen products.  Advil ordered for now.  DVT prophylaxis: SCDs Code Status: Full code Family / Patient Communication: Discussed with patient Disposition Plan:   Status is: Inpatient  Remains inpatient appropriate because:Unsafe d/c plan   Dispo: The patient is from: Home              Anticipated d/c is to: Inpatient psychiatry              Anticipated d/c date is: 1 day              Patient currently is medically stable to d/c.           Time spent: 15 min     >50% time spent in discussions with care team and coordination of care.    Alessandra Bevels, MD [facility] Pager in [place] [facility]  If 7PM-7AM, please contact night-coverage [email or web address] 12/10/2019, 4:41 PM
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
## 2019-12-11 ENCOUNTER — Inpatient Hospital Stay
Admission: AD | Admit: 2019-12-11 | Payer: Federal, State, Local not specified - Other | Source: Intra-hospital | Admitting: Psychiatry

## 2019-12-11 MED ORDER — IBUPROFEN 400 MG PO TABS: 600.0000 mg | ORAL_TABLET | Freq: Four times a day (QID) | ORAL | Status: AC | PRN

## 2019-12-11 MED ORDER — HYDRALAZINE HCL 10 MG PO TABS: 10.0000 mg | ORAL_TABLET | Freq: Three times a day (TID) | ORAL | Status: DC

## 2019-12-11 MED ORDER — PANTOPRAZOLE SODIUM 20 MG PO TBEC: 20.0000 mg | DELAYED_RELEASE_TABLET | Freq: Every day | ORAL | Status: DC

## 2019-12-11 MED ORDER — HYDROXYZINE HCL 10 MG PO TABS: 10.0000 mg | ORAL_TABLET | Freq: Three times a day (TID) | ORAL | 0 refills | Status: DC | PRN

## 2019-12-11 MED ORDER — SENNOSIDES-DOCUSATE SODIUM 8.6-50 MG PO TABS: 1.0000 | ORAL_TABLET | Freq: Two times a day (BID) | ORAL | Status: DC

## 2019-12-11 MED ORDER — ARIPIPRAZOLE 2 MG PO TABS: 2.0000 mg | ORAL_TABLET | Freq: Every day | ORAL | Status: DC

## 2019-12-11 MED ORDER — HYDROXYZINE HCL 10 MG PO TABS: 10.0000 mg | ORAL_TABLET | Freq: Every evening | ORAL | 0 refills | Status: DC | PRN

## 2019-12-11 MED ORDER — SERTRALINE HCL 25 MG PO TABS: 25.0000 mg | ORAL_TABLET | Freq: Every day | ORAL | Status: DC
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
## 2019-12-11 NOTE — TOC Progression Note (Addendum)
Transition of Care [facility]) - Progression Note    Patient Details  Name: Reginald Ballard MRN: [id number] Date of Birth: [birth year]  Transition [facility]) CM/SW Contact  Ida Rogue, [place] Phone Number: 12/11/2019, 9:57 AM  Clinical Narrative:   Asked [facility] to look at patient again today. No one is answering at [facility].  Will try again later   Expected Discharge Plan: Psychiatric Hospital Barriers to Discharge: Continued Medical Work up  Expected Discharge Plan and Services Expected Discharge Plan: Psychiatric Hospital   Discharge Planning Services: CM Consult   Living arrangements for the past 2 months: Apartment                                       Social Determinants of Health (SDOH) Interventions    Readmission Risk Interventions No flowsheet data found.

## 2019-12-11 NOTE — TOC Transition Note (Signed)
Transition of Care [facility]) - CM/SW Discharge Note   Patient Details  Name: Vedansh Kerstetter MRN: [id number] Date of Birth: 11/08/1990  Transition of Care [facility]) CM/SW Contact:  Ida Rogue, LCSW Phone Number: 12/11/2019, 4:10 PM   Clinical Narrative:   Patient accepted at [facility] for after 8:30 tonight. Called sheriff, who is not able to transport until tomorrow AM between 8 and 9AM. Patient and MD informed.  Nursing, please call report to number outlined in [place] note. TOC sign off.    Final next level of care: Psychiatric Hospital Barriers to Discharge: Barriers Resolved   Patient Goals and CMS Choice        Discharge Placement                       Discharge Plan and Services   Discharge Planning Services: CM Consult                                 Social Determinants of Health (SDOH) Interventions     Readmission Risk Interventions No flowsheet data found.

## 2019-12-11 NOTE — BH Assessment (Addendum)
Patient is scheduled for admission tomorrow morning    Patient has been accepted to [facility].  Accepting physician is Dr. Clapacs.  AttendingPhysician will be Dr. Clapacs.  Patient has been assigned to room (will be given at arrival), by [facility] Charge Nurse Joy  Call report to [phone].  Representative/Transfer Coordinator is Aloma Boch  Patient pre-admitted by [facility] Patient Access [facility]  [facility] ER Staff (Rodney, SW) made aware of acceptance. 

## 2019-12-12 DIAGNOSIS — J45902 Unspecified asthma with status asthmaticus: Secondary | ICD-10-CM

## 2019-12-12 DIAGNOSIS — F419 Anxiety disorder, unspecified: Secondary | ICD-10-CM

## 2019-12-12 MED ORDER — HYDRALAZINE HCL 10 MG PO TABS: 10.0000 mg | ORAL_TABLET | Freq: Three times a day (TID) | ORAL | 1 refills | Status: AC

## 2019-12-12 MED ORDER — SERTRALINE HCL 25 MG PO TABS: 25.0000 mg | ORAL_TABLET | Freq: Every day | ORAL | 0 refills | Status: AC

## 2019-12-12 MED ORDER — ARIPIPRAZOLE 2 MG PO TABS: 2.0000 mg | ORAL_TABLET | Freq: Every day | ORAL | 0 refills | Status: AC

## 2019-12-12 MED ORDER — HYDROXYZINE HCL 10 MG PO TABS: 10.0000 mg | ORAL_TABLET | Freq: Three times a day (TID) | ORAL | 0 refills | Status: AC | PRN

## 2019-12-12 NOTE — Progress Notes (Deleted)
D/c plan held yesterday due to lack of transportation to [facility]. D/c plan same as outlines in d/c summary yesterday. No new changes in clinic condition. D/c today when transportation available. Remains IVC/sitter. Vitals stable  This is a "No charge" encounter
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
## 2019-12-12 NOTE — TOC Transition Note (Signed)
Transition of Care [facility]) - CM/SW Discharge Note   Patient Details  Name: Reginald Ballard MRN: [id number] Date of Birth: 1991/04/28  [facility]) CM/SW Contact:  Ida Rogue, LCSW Phone Number: 12/12/2019, 12:54 PM   Clinical Narrative:   Patient to d/c today after being psychiatrically cleared.  Will turn over to [employer]. Per psych request, got patient a follow up appointment at his current provider, [facility]. TOC sign off.    Final next level of care: Home/Self Care Barriers to Discharge: Barriers Resolved   Patient Goals and CMS Choice        Discharge Placement                       Discharge Plan and Services   Discharge Planning Services: CM Consult                                 Social Determinants of Health (SDOH) Interventions     Readmission Risk Interventions No flowsheet data found.

## 2019-12-12 NOTE — Progress Notes (Signed)
PROGRESS NOTE    Reginald Ballard  BWL:[id number]  DOB: 04/30/1991  PCP: Patient, No Pcp Per Admit date:12/07/2019 Chief compliant: Medication overdose 29 year old male presents to the ED after intentional overdosed with Tylenol, Benadryl and ?  Nicotine patch.Patient states he was depressed and took about 50 tablets of Tylenol PM . Shortly after which he called for help and was brought to the ER. Patient also was noted that he took some nicotine patch. Denies taking any other medications. ED Course:In the ER patient was tachycardic and lab work showed normal complete metabolic panel WBC count was 11,000 EKG was showing sinus tachycardia initial acetaminophen level was 71 and second one was 160. INR is normal. Poison control advised to start patient on N-acetylcysteine. Patient's urine drug screen is positive for marijuana. Covid test is negative.  Hospital course: Patient admitted to [facility] for further evaluation and management.   Subjective: Denies any acute complaints except for headache and requesting pain medication.  Eating well and slept well.  Sitter at bedside  Objective: Vitals:   12/11/19 1504 12/11/19 2131 12/11/19 2247 12/12/19 1108  BP: (!) 149/93 135/75 (!) 144/75 (!) 159/101  Pulse: 89 68 70 78  Resp: 15 17 17 18   Temp: 98.5 F (36.9 C) 98 F (36.7 C) 98.1 F (36.7 C) 98.7 F (37.1 C)  TempSrc: Oral Oral Oral Oral  SpO2: 100% 99% 99% 99%  Weight:      Height:        Intake/Output Summary (Last 24 hours) at 12/12/2019 1343 Last data filed at 12/11/2019 1712 Gross per 24 hour  Intake 1080 ml  Output --  Net 1080 ml   Filed Weights   12/07/19 1739  Weight: 100 kg    Physical Examination: General: Thin built male, no acute distress noted Head ENT: Atraumatic normocephalic, PERRLA, neck supple Heart: S1-S2 heard, regular rate and rhythm, no murmurs.  No leg edema noted Lungs: Equal air entry bilaterally, no rhonchi or rales on exam, no accessory muscle  use Abdomen: Bowel sounds heard, soft, nontender, nondistended. No organomegaly.  No CVA tenderness Extremities: No pedal edema.  No cyanosis or clubbing. Neurological: Awake alert oriented x3, no focal weakness or numbness, strength and sensations to crude touch intact Skin: No wounds or rashes.   Data Reviewed: I have personally reviewed following labs and imaging studies  CBC: Recent Labs  Lab 12/05/19 2312 12/07/19 1735 12/08/19 0227 12/09/19 0127  WBC 11.3* 11.0* 10.9* 8.7  NEUTROABS  --  8.3*  --  6.3  HGB 15.3 15.6 15.8 15.0  HCT 44.7 44.6 45.8 43.9  MCV 89.9 88.8 89.5 90.3  PLT 300 258 285 247   Basic Metabolic Panel: Recent Labs  Lab 12/05/19 2312 12/07/19 1735 12/08/19 0227 12/08/19 2238 12/09/19 0127  NA 137 143 137 140 140  K 3.3* 3.5 3.5 3.6 3.6  CL 100 102 100 104 105  CO2 24 27 26 25 25   GLUCOSE 102* 80 129* 96 111*  BUN 10 10 8 8 9   CREATININE 1.15 1.08 1.01 1.09 0.90  CALCIUM 9.7 9.4 9.4 9.0 9.1  MG  --   --   --   --  2.1   GFR: Estimated Creatinine Clearance: 147.3 mL/min (by C-G formula based on SCr of 0.9 mg/dL). Liver Function Tests: Recent Labs  Lab 12/05/19 2312 12/07/19 1735 12/08/19 2238 12/09/19 0127  AST 28 21 29  33  ALT 31 27 39 42  ALKPHOS 49 55 46 43  BILITOT 0.7 0.6  0.8 0.7  PROT 8.2* 8.1 6.8 6.7  ALBUMIN 5.2* 4.7 3.7 3.9   No results for input(s): LIPASE, AMYLASE in the last 168 hours. No results for input(s): AMMONIA in the last 168 hours. Coagulation Profile: Recent Labs  Lab 12/07/19 1735 12/08/19 2238 12/09/19 0127  INR 1.1 1.3* 1.4*   Cardiac Enzymes: No results for input(s): CKTOTAL, CKMB, CKMBINDEX, TROPONINI in the last 168 hours. BNP (last 3 results) No results for input(s): PROBNP in the last 8760 hours. HbA1C: No results for input(s): HGBA1C in the last 72 hours. CBG: Recent Labs  Lab 12/08/19 0145 12/08/19 0927  GLUCAP 139* 82   Lipid Profile: No results for input(s): CHOL, HDL, LDLCALC,  TRIG, CHOLHDL, LDLDIRECT in the last 72 hours. Thyroid Function Tests: No results for input(s): TSH, T4TOTAL, FREET4, T3FREE, THYROIDAB in the last 72 hours. Anemia Panel: No results for input(s): VITAMINB12, FOLATE, FERRITIN, TIBC, IRON, RETICCTPCT in the last 72 hours. Sepsis Labs: No results for input(s): PROCALCITON, LATICACIDVEN in the last 168 hours.  Recent Results (from the past 240 hour(s))  SARS Coronavirus 2 by RT PCR (hospital order, performed in [facility] hospital lab) Nasopharyngeal Nasopharyngeal Swab     Status: None   Collection Time: 12/07/19  5:35 PM   Specimen: Nasopharyngeal Swab  Result Value Ref Range Status   SARS Coronavirus 2 NEGATIVE NEGATIVE Final    Comment: (NOTE) SARS-CoV-2 target nucleic acids are NOT DETECTED.  The SARS-CoV-2 RNA is generally detectable in upper and lower respiratory specimens during the acute phase of infection. The lowest concentration of SARS-CoV-2 viral copies this assay can detect is 250 copies / mL. A negative result does not preclude SARS-CoV-2 infection and should not be used as the sole basis for treatment or other patient management decisions.  A negative result may occur with improper specimen collection / handling, submission of specimen other than nasopharyngeal swab, presence of viral mutation(s) within the areas targeted by this assay, and inadequate number of viral copies (<250 copies / mL). A negative result must be combined with clinical observations, patient history, and epidemiological information.  Fact Sheet for Patients:   BoilerBrush.com.cy  Fact Sheet for Healthcare Providers: https://pope.com/  This test is not yet approved or  cleared by the [place] FDA and has been authorized for detection and/or diagnosis of SARS-CoV-2 by FDA under an Emergency Use Authorization (EUA).  This EUA will remain in effect (meaning this test can be used) for the  duration of the COVID-19 declaration under Section 564(b)(1) of the Act, 21 U.S.C. section 360bbb-3(b)(1), unless the authorization is terminated or revoked sooner.  Performed at [facility], [address], [place], [place] [address]   MRSA PCR Screening     Status: None   Collection Time: 12/08/19  1:06 AM   Specimen: Nasal Mucosa; Nasopharyngeal  Result Value Ref Range Status   MRSA by PCR NEGATIVE NEGATIVE Final    Comment:        The GeneXpert MRSA Assay (FDA approved for NASAL specimens only), is one component of a comprehensive MRSA colonization surveillance program. It is not intended to diagnose MRSA infection nor to guide or monitor treatment for MRSA infections. Performed at [facility], [address], [place], [place] [address]       Radiology Studies: No results found.    Scheduled Meds: . ARIPiprazole  2 mg Oral Daily  . Chlorhexidine Gluconate Cloth  6 each Topical Daily  . hydrALAZINE  10 mg  Oral Q8H  . mouth rinse  15 mL Mouth Rinse BID  . pantoprazole  20 mg Oral Daily  . senna-docusate  1 tablet Oral BID  . sertraline  25 mg Oral Daily   Continuous Infusions:   Assessment/Plan:  1.Intentional acetaminophen/Benadryl overdose: Received N-acetylcysteine for 24 hours. LFTs, INR closely monitored and normalized.  2.Depression/anxiety, suicidal ideation:TSH 0.5, psychiatry recommended IVC/inpatient psych placement. Resumed home medications. Hydroxyzine scheduled and as needed for anxiety available. Medically cleared, awaiting psych bed and sitter at bedside.  Patient has bed at [facility] behavioral health facility today and medically cleared for discharge.  3.Hypertension:Noted to have elevated blood pressures on presentation. No prior diagnosis. Started on hydralazine 10 mg 3 times daily.  4.Alcohol abuse: Alcohol level less than 10 on presentation. No signs of withdrawal during this hospital  stay.  5.Reactive leukocytosis/hypokalemia: Now resolved.  6.Asthma, tobacco use: MDI as needed. Counseled to quit tobacco use. There was some concern about nicotine patch over use on admission. Currently no signs of withdrawal. Monitor.  7.Headache: Avoid acetaminophen products. Advil ordered for now.   DVT prophylaxis: SCDs Code Status: Full code Family / Patient Communication: Discussed with patient Disposition Plan:   Status is: Inpatient  Remains inpatient appropriate because:Unsafe d/c plan   Dispo: The patient is from: Home              Anticipated d/c is to: Inpatient psychiatry              Anticipated d/c date is: 1 day              Patient currently is medically stable to d/c.           Time spent: 15 min     >50% time spent in discussions with care team and coordination of care.    Alessandra Bevels, MD [facility] Pager in [place]  If 7PM-7AM, please contact night-coverage [email or web address] 12/12/2019, 1:43 PM

## 2019-12-12 NOTE — Consult Note (Signed)
[facility] ED Progress Note  12/12/2019 1:08 PM Reginald Ballard  MRN:  [id number]   Subjective:  "I'm doing alright Reginald Ballard, 29 y.o., male patient seen face to face by this provider, consulted with Dr. Lucianne Muss; and chart reviewed on 12/12/19.  On evaluation Reginald Ballard reports he is feeling better and no longer hearing voices.  Patient states that he has been tolerating his mediations without adverse reaction and sleeping without difficulty.  Patient states that he already has outpatient services with [facility] and would like to stay there and he is currently living in a halfway house and is able to return after discharge.   During evaluation Reginald Ballard is alert/oriented x 4; calm/cooperative; and mood is congruent with affect.  He does not appear to be responding to internal/external stimuli or delusional thoughts.  Patient denies suicidal/self-harm/homicidal ideation, psychosis, and paranoia.  Patient answered question appropriately.     Principal Problem: Tylenol overdose Diagnosis:  Principal Problem:   Tylenol overdose Active Problems:   Suicide attempt (HCC)  Total Time spent with patient: 30 minutes  Past Psychiatric History: ADHD, Depression  Past Medical History:  Past Medical History:  Diagnosis Date  . Asthma    History reviewed. No pertinent surgical history. Family History:  Family History  Family history unknown: Yes   Family Psychiatric  History: Brother with schizophrenia Social History:  Social History   Substance and Sexual Activity  Alcohol Use Yes   Comment: daily     Social History   Substance and Sexual Activity  Drug Use Yes  . Types: Marijuana, Cocaine    Social History   Socioeconomic History  . Marital status: Single    Spouse name: Not on file  . Number of children: Not on file  . Years of education: Not on file  . Highest education level: Not on file  Occupational History  . Not on file  Tobacco Use  . Smoking status:  Current Every Day Smoker    Packs/day: 1.00  . Smokeless tobacco: Never Used  Substance and Sexual Activity  . Alcohol use: Yes    Comment: daily  . Drug use: Yes    Types: Marijuana, Cocaine  . Sexual activity: Not on file  Other Topics Concern  . Not on file  Social History Narrative   ** Merged History Encounter **       Social Determinants of Health   Financial Resource Strain:   . Difficulty of Paying Living Expenses:   Food Insecurity:   . Worried About Programme researcher, broadcasting/film/video in the Last Year:   . Barista in the Last Year:   Transportation Needs:   . Freight forwarder (Medical):   Marland Kitchen Lack of Transportation (Non-Medical):   Physical Activity:   . Days of Exercise per Week:   . Minutes of Exercise per Session:   Stress:   . Feeling of Stress :   Social Connections:   . Frequency of Communication with Friends and Family:   . Frequency of Social Gatherings with Friends and Family:   . Attends Religious Services:   . Active Member of Clubs or Organizations:   . Attends Banker Meetings:   Marland Kitchen Marital Status:     Sleep: Good  Appetite:  Good  Current Medications: Current Facility-Administered Medications  Medication Dose Route Frequency Provider Last Rate Last Admin  . ARIPiprazole (ABILIFY) tablet 2 mg  2 mg Oral Daily Starkes-Perry, Juel Burrow, FNP   2 mg  at 12/12/19 1033  . Chlorhexidine Gluconate Cloth 2 % PADS 6 each  6 each Topical Daily Eduard Clos, MD   6 each at 12/08/19 2012  . hydrALAZINE (APRESOLINE) tablet 10 mg  10 mg Oral Q8H Albertine Grates, MD   10 mg at 12/12/19 0557  . hydrOXYzine (ATARAX/VISTARIL) tablet 10 mg  10 mg Oral TID PRN Albertine Grates, MD   10 mg at 12/12/19 1111  . hydrOXYzine (ATARAX/VISTARIL) tablet 10 mg  10 mg Oral QHS PRN Albertine Grates, MD   10 mg at 12/09/19 2311  . ibuprofen (ADVIL) tablet 200 mg  200 mg Oral Q6H PRN Alessandra Bevels, MD   200 mg at 12/12/19 0557  . MEDLINE mouth rinse  15 mL Mouth Rinse BID  Eduard Clos, MD      . ondansetron [facility]) injection 4 mg  4 mg Intravenous Q8H PRN Albertine Grates, MD      . pantoprazole (PROTONIX) EC tablet 20 mg  20 mg Oral Daily Albertine Grates, MD   20 mg at 12/12/19 1033  . senna-docusate (Senokot-S) tablet 1 tablet  1 tablet Oral BID Albertine Grates, MD   1 tablet at 12/11/19 2120  . sertraline (ZOLOFT) tablet 25 mg  25 mg Oral Daily Maryagnes Amos, FNP   25 mg at 12/12/19 1033    Lab Results: No results found for this or any previous visit (from the past 48 hour(s)).  Blood Alcohol level:  Lab Results  Component Value Date   ETH <10 12/07/2019   ETH 101 (H) 03/16/2014    Physical Findings: AIMS:  , ,  ,  ,    CIWA:    COWS:     Musculoskeletal: Strength & Muscle Tone: within normal limits Gait & Station: normal Patient leans: N/A  Psychiatric Specialty Exam: Physical Exam  Review of Systems  Blood pressure (!) 159/101, pulse 78, temperature 98.7 F (37.1 C), temperature source Oral, resp. rate 18, height 6\' 4"  (1.93 m), weight 100 kg, SpO2 99 %.Body mass index is 26.84 kg/m.  General Appearance: Casual  Eye Contact:  Good  Speech:  Clear and Coherent and Normal Rate  Volume:  Normal  Mood:  "Alright"  Appropriate  Affect:  Appropriate and Congruent  Thought Process:  Coherent, Goal Directed and Descriptions of Associations: Intact  Orientation:  Full (Time, Place, and Person)  Thought Content:  WDL  Suicidal Thoughts:  No  Homicidal Thoughts:  No  Memory:  Immediate;   Good Recent;   Good  Judgement:  Intact  Insight:  Present Plans to move back to the halfway house; look for work and find a place of his own  Psychomotor Activity:  Normal  Concentration:  Concentration: Good and Attention Span: Good  Recall:  Good  Fund of Knowledge:  Fair  Language:  Good  Akathisia:  No  Handed:  Right  AIMS (if indicated):     Assets:  Communication Skills Desire for Improvement  ADL's:  Intact  Cognition:  WNL  Sleep:       Assessment:  Patient reporting that he is feeling better and currently not having any auditory hallucinations.  States he is not feeling overwhelmed and has been tolerating his medications without adverse reaction. Patient is no longer suicidal and also denies homicidal ideation, psychosis, and paranoia.  Patient currently has outpatient services with [facility] and want to continue services there.  Social work will set up an appointment before patient is discharged.  Patient also  states he will return to halfway house and has plans to find work so he can get a place of his own.    Treatment Plan Summary: Plan Psychiatric clear to follow up with outpatient psychiatric provider    Follow-up Information    [facility] Follow up on 12/13/2019.   Why: Thursday at 10:00 with your counselor.  Please call to reschedule if this is not a convenient time Contact information: [address] [place], [place] [address] Phone: [phone]             Disposition:  Psychiatrically cleared No evidence of imminent risk to self or others at present.   Patient does not meet criteria for psychiatric inpatient admission. Supportive therapy provided about ongoing stressors. Discussed crisis plan, support from social network, calling 911, coming to the Emergency Department, and calling Suicide Hotline.  Message sent to Dr. Lajuana Ripple informing patient has been seen by psychiatry and psychiatrically cleared to follow up with outpatient psychiatric provider.    Orey Moure, NP 12/12/2019, 1:08 PM
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
## 2019-12-25 ENCOUNTER — Emergency Department (HOSPITAL_COMMUNITY)

## 2019-12-25 ENCOUNTER — Other Ambulatory Visit: Payer: Self-pay

## 2019-12-25 ENCOUNTER — Encounter (HOSPITAL_COMMUNITY): Payer: Self-pay

## 2019-12-25 ENCOUNTER — Emergency Department (HOSPITAL_COMMUNITY)
Admission: EM | Admit: 2019-12-25 | Discharge: 2019-12-26 | Disposition: A | Discharge status: Home / Self Care | Attending: Emergency Medicine | Admitting: Emergency Medicine

## 2019-12-25 DIAGNOSIS — F17200 Nicotine dependence, unspecified, uncomplicated: Secondary | ICD-10-CM | POA: Diagnosis not present

## 2019-12-25 DIAGNOSIS — R410 Disorientation, unspecified: Secondary | ICD-10-CM | POA: Diagnosis present

## 2019-12-25 DIAGNOSIS — M542 Cervicalgia: Secondary | ICD-10-CM | POA: Insufficient documentation

## 2019-12-25 DIAGNOSIS — R45851 Suicidal ideations: Secondary | ICD-10-CM | POA: Diagnosis not present

## 2019-12-25 DIAGNOSIS — R202 Paresthesia of skin: Secondary | ICD-10-CM | POA: Insufficient documentation

## 2019-12-25 DIAGNOSIS — T71162A Asphyxiation due to hanging, intentional self-harm, initial encounter: Secondary | ICD-10-CM

## 2019-12-25 DIAGNOSIS — J45909 Unspecified asthma, uncomplicated: Secondary | ICD-10-CM | POA: Diagnosis not present

## 2019-12-25 DIAGNOSIS — Z20822 Contact with and (suspected) exposure to covid-19: Secondary | ICD-10-CM | POA: Insufficient documentation

## 2019-12-25 LAB — CBC WITH DIFFERENTIAL/PLATELET
Abs Immature Granulocytes: 0.01 10*3/uL (ref 0.00–0.07)
Basophils Absolute: 0.1 10*3/uL (ref 0.0–0.1)
Basophils Relative: 1 %
Eosinophils Absolute: 0.1 10*3/uL (ref 0.0–0.5)
Eosinophils Relative: 1 %
HCT: 44.4 % (ref 39.0–52.0)
Hemoglobin: 15.4 g/dL (ref 13.0–17.0)
Immature Granulocytes: 0 %
Lymphocytes Relative: 34 %
Lymphs Abs: 2.5 10*3/uL (ref 0.7–4.0)
MCH: 30.6 pg (ref 26.0–34.0)
MCHC: 34.7 g/dL (ref 30.0–36.0)
MCV: 88.3 fL (ref 80.0–100.0)
Monocytes Absolute: 0.5 10*3/uL (ref 0.1–1.0)
Monocytes Relative: 7 %
Neutro Abs: 4.2 10*3/uL (ref 1.7–7.7)
Neutrophils Relative %: 57 %
Platelets: 229 10*3/uL (ref 150–400)
RBC: 5.03 MIL/uL (ref 4.22–5.81)
RDW: 12.2 % (ref 11.5–15.5)
WBC: 7.3 10*3/uL (ref 4.0–10.5)
nRBC: 0 % (ref 0.0–0.2)

## 2019-12-25 LAB — COMPREHENSIVE METABOLIC PANEL
ALT: 27 U/L (ref 0–44)
AST: 20 U/L (ref 15–41)
Albumin: 4.5 g/dL (ref 3.5–5.0)
Alkaline Phosphatase: 54 U/L (ref 38–126)
Anion gap: 8 (ref 5–15)
BUN: 12 mg/dL (ref 6–20)
CO2: 28 mmol/L (ref 22–32)
Calcium: 9.2 mg/dL (ref 8.9–10.3)
Chloride: 102 mmol/L (ref 98–111)
Creatinine, Ser: 1.07 mg/dL (ref 0.61–1.24)
GFR calc Af Amer: >60 mL/min (ref >60)
GFR calc non Af Amer: >60 mL/min (ref >60)
Glucose, Bld: 85 mg/dL (ref 70–99)
Potassium: 3.7 mmol/L (ref 3.5–5.1)
Sodium: 138 mmol/L (ref 135–145)
Total Bilirubin: 0.7 mg/dL (ref 0.3–1.2)
Total Protein: 7.3 g/dL (ref 6.5–8.1)

## 2019-12-25 MED ORDER — ACETAMINOPHEN 325 MG PO TABS: 650.0000 mg | ORAL_TABLET | ORAL | Status: DC | PRN

## 2019-12-25 MED ORDER — ZOLPIDEM TARTRATE 5 MG PO TABS: 5.0000 mg | ORAL_TABLET | Freq: Every evening | ORAL | Status: DC | PRN

## 2019-12-25 MED ORDER — ONDANSETRON HCL 4 MG PO TABS: 4.0000 mg | ORAL_TABLET | Freq: Three times a day (TID) | ORAL | Status: DC | PRN

## 2019-12-25 MED ORDER — LORAZEPAM 1 MG PO TABS: 1.0000 mg | ORAL_TABLET | ORAL | Status: DC | PRN

## 2019-12-25 MED ORDER — RISPERIDONE 1 MG PO TBDP: 2.0000 mg | ORAL_TABLET | Freq: Three times a day (TID) | ORAL | Status: DC | PRN

## 2019-12-25 MED ORDER — ZIPRASIDONE MESYLATE 20 MG IM SOLR: 20.0000 mg | INTRAMUSCULAR | Status: DC | PRN
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
## 2019-12-26 ENCOUNTER — Emergency Department (HOSPITAL_COMMUNITY)

## 2019-12-26 ENCOUNTER — Encounter (HOSPITAL_COMMUNITY): Payer: Self-pay

## 2019-12-26 LAB — RAPID URINE DRUG SCREEN, HOSP PERFORMED
Amphetamines: NOT DETECTED
Barbiturates: NOT DETECTED
Benzodiazepines: NOT DETECTED
Cocaine: NOT DETECTED
Opiates: NOT DETECTED
Tetrahydrocannabinol: NOT DETECTED

## 2019-12-26 LAB — SARS CORONAVIRUS 2 BY RT PCR (HOSPITAL ORDER, PERFORMED IN ~~LOC~~ HOSPITAL LAB): SARS Coronavirus 2: NEGATIVE

## 2019-12-26 LAB — SALICYLATE LEVEL: Salicylate Lvl: <7 mg/dL — ABNORMAL LOW (ref 7.0–30.0)

## 2019-12-26 LAB — ACETAMINOPHEN LEVEL: Acetaminophen (Tylenol), Serum: <10 ug/mL — ABNORMAL LOW (ref 10–30)

## 2019-12-26 LAB — CK: Total CK: 112 U/L (ref 49–397)

## 2019-12-26 LAB — ETHANOL: Alcohol, Ethyl (B): <10 mg/dL (ref <10)

## 2019-12-26 MED ORDER — IOHEXOL 350 MG/ML SOLN
100.0000 mL | Freq: Once | INTRAVENOUS | Status: AC | PRN
  Administered 2019-12-26: 100 mL via INTRAVENOUS

## 2019-12-26 NOTE — ED Notes (Signed)
DC'd to care of LE back to prison. LEO signed Dc papers

## 2019-12-26 NOTE — ED Provider Notes (Signed)
I assumed care of this patient.  Please see previous provider note for further details of Hx, PE.  Briefly patient is a 29 y.o. male who presented after suicide attempt by hanging.  Patient came from jail.  Plan to follow-up labs and imaging.   Imaging and labs all reassuring.  The patient appears reasonably screened and/or stabilized for discharge and I doubt any other medical condition or other [facility] requiring further screening, evaluation, or treatment in the ED at this time prior to discharge. Safe for discharge with strict return precautions.  Disposition: Discharge to PD and Jail staff  Condition: Good  I have discussed the results, Dx and Tx plan with the patient/family who expressed understanding and agree(s) with the plan. Discharge instructions discussed at length. The patient/family was given strict return precautions who verbalized understanding of the instructions. No further questions at time of discharge.    ED Discharge Orders    None         Tiaja Hagan, Amadeo Garnet, MD 12/26/19 [phone]

## 2019-12-26 NOTE — ED Notes (Signed)
Patient transported to CT 
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
## 2022-03-28 IMAGING — CT CT ANGIO NECK
2 of 7 series · 8 of 33 positions shown · IV contrast (OMNIPAQUE 350)
Comparison: None available.

CLINICAL DATA: Initial evaluation for acute neck trauma./

EXAM:
CT ANGIOGRAPHY NECK
TECHNIQUE: Multidetector CT imaging of the neck was performed using the
standard protocol during bolus administration of intravenous
contrast. Multiplanar CT image reconstructions and MIPs were
obtained to evaluate the vascular anatomy. Carotid stenosis
measurements (when applicable) are obtained utilizing NASCET
criteria, using the distal internal carotid diameter as the
denominator.
CONTRAST:  100mL OMNIPAQUE IOHEXOL 350 MG/ML SOLN

[Series 6: cta neck · axial · 0.45mm/px · z∈[-195,-111]mm · 2 of 128 slices shown]
[im 43/128  soft-tissue]
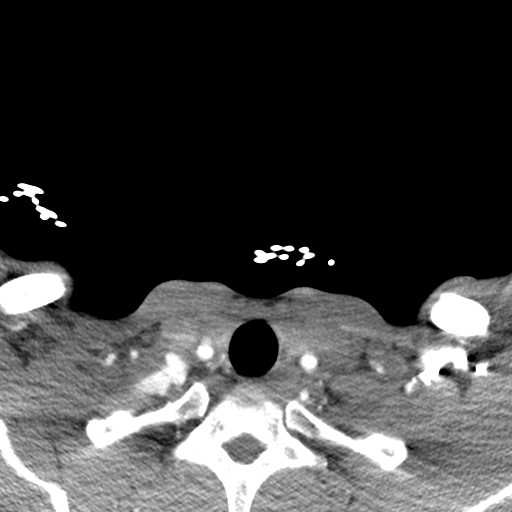
[im 85/128  soft-tissue]
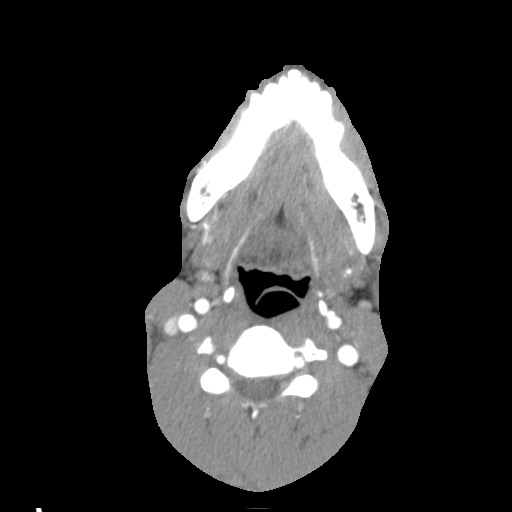

[Series 8: ax thin · axial · 0.39mm/px · z∈[-243,-61]mm · 6 of 256 slices shown]
[im 37/256  soft-tissue]
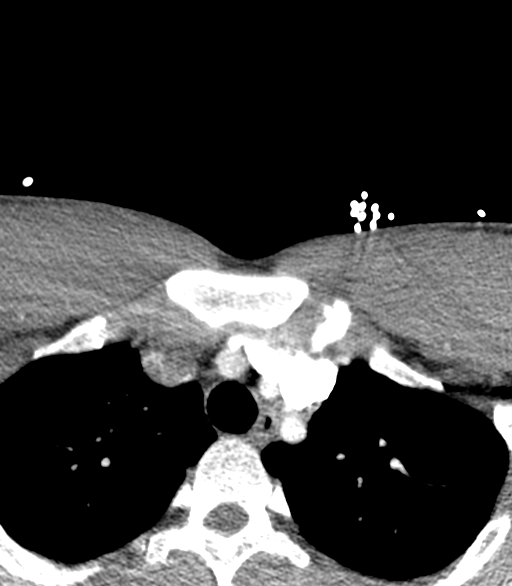
[im 73/256  bone]
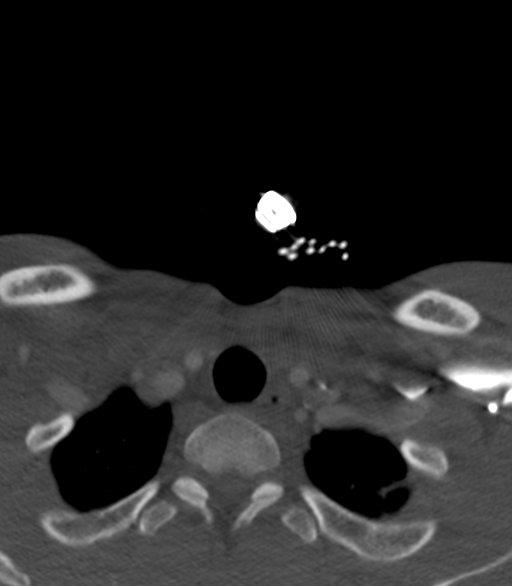
[im 110/256  soft-tissue]
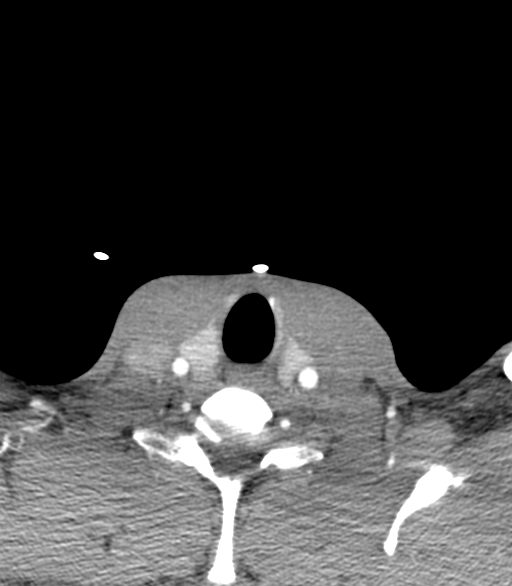
[im 146/256  bone]
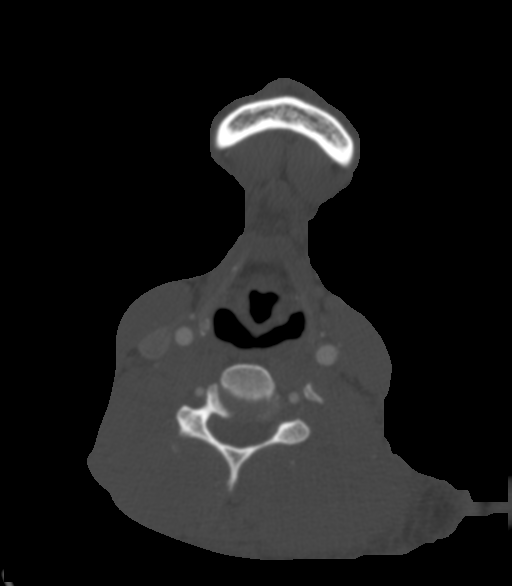
[im 183/256  soft-tissue]
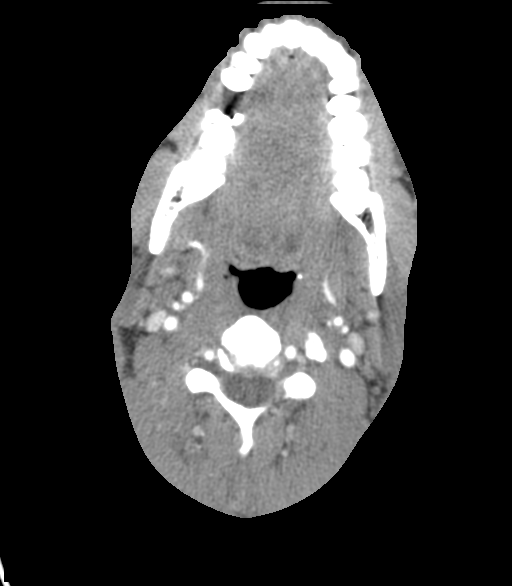
[im 219/256  bone]
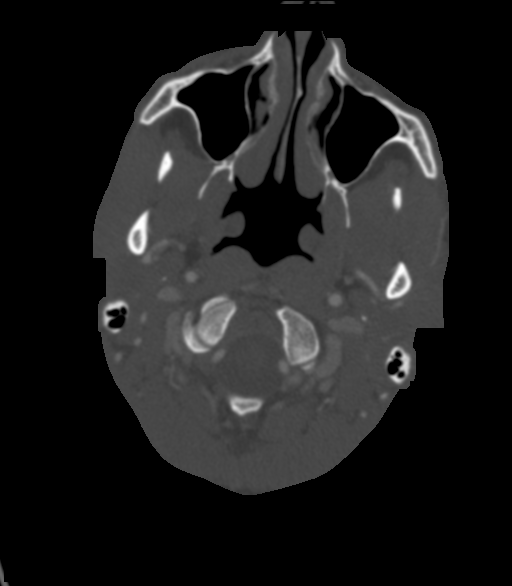

[8 of 33 positions shown; findings below may reference images not displayed]

FINDINGS: Aortic arch: Visualized aortic arch of normal caliber with normal
branch pattern. No stenosis or other acute abnormality about the
origin of the great vessels.

Right carotid system: Right common and internal carotid arteries
widely patent without stenosis, dissection or occlusion. Right
external carotid artery and its branches intact.

Left carotid system: Left common and internal carotid arteries
widely patent without stenosis, dissection, or occlusion. Left
external carotid artery and its branches intact.

Vertebral arteries: Both vertebral arteries arise from the
subclavian arteries. Vertebral arteries widely patent without
stenosis, dissection or occlusion.

Skeleton: No visible acute osseous abnormality. No discrete or
worrisome osseous lesions.

Other neck: No other acute soft tissue abnormality within the neck.
No mass lesion or adenopathy.

Upper chest: Area of irregular parenchymal opacity noted within the
peripheral right upper lobe (series 6, image 3). Few scattered
superimposed calcified granulomas noted within this region.
Additional 3 mm nodule noted at the right lung apex (series 6, image
32). Visualized upper chest demonstrates no other acute finding.
IMPRESSION: 1. Negative CTA of the neck. No acute traumatic vascular injury
identified.
2. Irregular parenchymal opacity with superimposed calcified
granulomas at the peripheral right upper lobe as above, likely
related to prior granulomatous infection, including tuberculosis.
